# Patient Record
Sex: Male | Born: 1965 | Race: White | Hispanic: No | Marital: Married | Smoking: Current every day smoker
Health system: Southern US, Community
[De-identification: ages and names within clinical notes are randomized; demographics above are authoritative.]

## PROBLEM LIST (undated history)

## (undated) DIAGNOSIS — M1990 Unspecified osteoarthritis, unspecified site: Secondary | ICD-10-CM

## (undated) DIAGNOSIS — Z87442 Personal history of urinary calculi: Secondary | ICD-10-CM

## (undated) DIAGNOSIS — E7800 Pure hypercholesterolemia, unspecified: Secondary | ICD-10-CM

## (undated) DIAGNOSIS — F419 Anxiety disorder, unspecified: Secondary | ICD-10-CM

## (undated) DIAGNOSIS — I10 Essential (primary) hypertension: Secondary | ICD-10-CM

## (undated) DIAGNOSIS — E119 Type 2 diabetes mellitus without complications: Secondary | ICD-10-CM

## (undated) DIAGNOSIS — F329 Major depressive disorder, single episode, unspecified: Secondary | ICD-10-CM

## (undated) DIAGNOSIS — R011 Cardiac murmur, unspecified: Secondary | ICD-10-CM

## (undated) DIAGNOSIS — F32A Depression, unspecified: Secondary | ICD-10-CM

## (undated) HISTORY — PX: KNEE ARTHROSCOPY: SUR90

## (undated) HISTORY — PX: KNEE SURGERY: SHX244

---
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
## 2004-10-14 HISTORY — PX: OTHER SURGICAL HISTORY: SHX169

## 2005-05-06 ENCOUNTER — Encounter: Admission: RE | Admit: 2005-05-06 | Discharge: 2005-05-06 | Payer: Self-pay | Admitting: Orthopedic Surgery

## 2005-05-14 ENCOUNTER — Inpatient Hospital Stay (HOSPITAL_COMMUNITY): Admission: RE | Admit: 2005-05-14 | Discharge: 2005-05-16 | Payer: Self-pay | Admitting: Orthopedic Surgery

## 2005-06-05 ENCOUNTER — Encounter: Admission: RE | Admit: 2005-06-05 | Discharge: 2005-09-03 | Payer: Self-pay | Admitting: Orthopedic Surgery

## 2005-09-04 ENCOUNTER — Encounter: Admission: RE | Admit: 2005-09-04 | Discharge: 2005-10-25 | Payer: Self-pay | Admitting: Orthopedic Surgery

## 2013-04-21 ENCOUNTER — Other Ambulatory Visit: Payer: Self-pay | Admitting: Urology

## 2013-05-11 ENCOUNTER — Encounter (HOSPITAL_BASED_OUTPATIENT_CLINIC_OR_DEPARTMENT_OTHER): Payer: Self-pay | Admitting: *Deleted

## 2013-05-11 NOTE — Progress Notes (Signed)
NPO AFTER MN. ARRIVES AT 0600. NEEDS HG.  

## 2013-05-17 NOTE — H&P (Signed)
eason For Visit  [name] presents today for a 3 week check   History of Present Illness       [name] has a history of nephrolithiasis.  In 2012 he had a known left renal stone.  In early June 2014 he had some gross hematuria and mild left flank pain.  A KUB in our office did not show an obvious stone in the kidney as previously seen.  He was given Rapaflo for MET of possible ureteral stone.    Interval history: [name] presents today for a 3 week check.  He has not had any further gross hematuria or flank pain since his visit 3 weeks ago. He has not seen a stone pass. He denies any LUTS, dysuria, n/v, or fever. UA shows 11- 20 RBCs.   Past Medical History Problems  1. History of  Gross Hematuria 599.71 2. History of  Nephrolithiasis V13.01  Surgical History Problems  1. History of  Knee Surgery Left  Current Meds 1. No Reported Medications  Allergies Medication  1. No Known Drug Allergies  Family History Problems  1. Family history of  Death In The Family Father Died age 23-Heart 2. Maternal grandfather's history of  Diabetes Mellitus V18.0 3. Family history of  Family Health Status Number Of Children 1 son and 1 daughter 4. Paternal history of  Nephrolithiasis  Social History Problems  1. Alcohol Use 2 PER DAY 2. Caffeine Use 1-2 PER DAY 3. Marital History - Currently Married 4. Occupation: Self-employed 5. Tobacco Use 305.1 Smoked 1ppd for 25 years  Review of Systems Genitourinary, constitutional and gastrointestinal system(s) were reviewed and pertinent findings if present are noted.    Vitals Vital Signs [Data Includes: Last 1 Day]  08Jul2014 10:58AM  Blood Pressure: 142 / 98 Temperature: 99 F Heart Rate: 94  Physical Exam Constitutional: Well nourished and well developed . No acute distress.  Pulmonary: No respiratory distress and normal respiratory rhythm and effort.  Abdomen: The abdomen is soft and nontender. No CVA tenderness.   Neuro/Psych:. Mood and affect are appropriate.    Results/Data Urine [Data Includes: Last 1 Day]   08Jul2014  COLOR YELLOW   APPEARANCE CLEAR   SPECIFIC GRAVITY 1.020   pH 7.0   GLUCOSE NEG mg/dL  BILIRUBIN NEG   KETONE NEG mg/dL  BLOOD SMALL   PROTEIN NEG mg/dL  UROBILINOGEN 0.2 mg/dL  NITRITE NEG   LEUKOCYTE ESTERASE NEG   SQUAMOUS EPITHELIAL/HPF NONE SEEN   WBC NONE SEEN WBC/hpf  RBC 11-20 RBC/hpf  BACTERIA NONE SEEN   CRYSTALS NONE SEEN   CASTS NONE SEEN    The following images/tracing/specimen were independently visualized: Marland Kitchen  KUB: no obvious stones seen on KUB today overlying either kidney or along the course of either ureter CT: large 9+ mm stone seen in left ureter associated with hydro, no other obvious stones or renal lesions seen; official report still pending    Assessment Assessed  1. Gross Hematuria 599.71   [name] has ongoing micro hematuria today without evidence of stones on KUB.  For this reason, a CT hematuria protocol was performed. I see a large probably 9 mm stone in the left distal ureter associated with hydro.  I do not see any other stones or any other malignant cause of hematuria, but I am still awaiting the official radiology report.  He is going to continue Rapaflo.  I do not think hell be able to pass this, but maybe.  I am  going to send his CT to Dr. Annabell Howells.  He will likely need to be scheduled for ureteroscopy, stone extraction, and possible stent.  If he develops flank pain uncontrolled with medication, n/v, or a temp of 100. 5 or greater he knows to call.  He will also call if he passes the stone.   Plan Gross Hematuria (599.71)  1. AU CT-HEMATURIA PROTOCOL  Done: 08Jul2014 12:00AM Health Maintenance (V70.0)  2. UA With REFLEX  Done: 08Jul2014 10:38AM   1. Continue MET of 9mm distal ureteral stone on left 2. Consult with Dr. Annabell Howells about ureteroscopy/ stone extraction

## 2013-05-18 ENCOUNTER — Encounter (HOSPITAL_BASED_OUTPATIENT_CLINIC_OR_DEPARTMENT_OTHER)
Admission: RE | Disposition: A | Discharge status: Home / Self Care | Payer: Self-pay | Source: Ambulatory Visit | Attending: Urology

## 2013-05-18 ENCOUNTER — Encounter (HOSPITAL_BASED_OUTPATIENT_CLINIC_OR_DEPARTMENT_OTHER): Payer: Self-pay | Admitting: Anesthesiology

## 2013-05-18 ENCOUNTER — Ambulatory Visit (HOSPITAL_COMMUNITY): Payer: 59

## 2013-05-18 ENCOUNTER — Ambulatory Visit (HOSPITAL_BASED_OUTPATIENT_CLINIC_OR_DEPARTMENT_OTHER): Payer: 59 | Admitting: Anesthesiology

## 2013-05-18 ENCOUNTER — Ambulatory Visit (HOSPITAL_BASED_OUTPATIENT_CLINIC_OR_DEPARTMENT_OTHER)
Admission: RE | Admit: 2013-05-18 | Discharge: 2013-05-18 | Disposition: A | Discharge status: Home / Self Care | Payer: 59 | Source: Ambulatory Visit | Attending: Urology | Admitting: Urology

## 2013-05-18 ENCOUNTER — Encounter (HOSPITAL_BASED_OUTPATIENT_CLINIC_OR_DEPARTMENT_OTHER): Payer: Self-pay | Admitting: *Deleted

## 2013-05-18 DIAGNOSIS — E669 Obesity, unspecified: Secondary | ICD-10-CM | POA: Insufficient documentation

## 2013-05-18 DIAGNOSIS — Z302 Encounter for sterilization: Secondary | ICD-10-CM | POA: Insufficient documentation

## 2013-05-18 DIAGNOSIS — N201 Calculus of ureter: Secondary | ICD-10-CM | POA: Insufficient documentation

## 2013-05-18 DIAGNOSIS — F17200 Nicotine dependence, unspecified, uncomplicated: Secondary | ICD-10-CM | POA: Insufficient documentation

## 2013-05-18 HISTORY — PX: VASECTOMY: SHX75

## 2013-05-18 HISTORY — PX: CYSTOSCOPY WITH URETEROSCOPY AND STENT PLACEMENT: SHX6377

## 2013-05-18 HISTORY — PX: HOLMIUM LASER APPLICATION: SHX5852

## 2013-05-18 LAB — POCT HEMOGLOBIN-HEMACUE: Hemoglobin: 15.9 g/dL (ref 13.0–17.0)

## 2013-05-18 SURGERY — CYSTOURETEROSCOPY, WITH STENT INSERTION
Anesthesia: General | Site: Ureter | Laterality: Left | Wound class: Clean Contaminated

## 2013-05-18 MED ORDER — KETOROLAC TROMETHAMINE 30 MG/ML IJ SOLN
INTRAMUSCULAR | Status: DC | PRN
  Administered 2013-05-18: 30 mg via INTRAVENOUS

## 2013-05-18 MED ORDER — DEXAMETHASONE SODIUM PHOSPHATE 4 MG/ML IJ SOLN
INTRAMUSCULAR | Status: DC | PRN
  Administered 2013-05-18: 10 mg via INTRAVENOUS

## 2013-05-18 MED ORDER — SODIUM CHLORIDE 0.9 % IV SOLN
250.0000 mL | INTRAVENOUS | Status: DC | PRN
  Filled 2013-05-18: qty 250

## 2013-05-18 MED ORDER — ACETAMINOPHEN 650 MG RE SUPP
650.0000 mg | RECTAL | Status: DC | PRN
  Filled 2013-05-18: qty 1

## 2013-05-18 MED ORDER — OXYCODONE-ACETAMINOPHEN 5-325 MG PO TABS: 1.0000 | ORAL_TABLET | ORAL | Status: DC | PRN

## 2013-05-18 MED ORDER — SODIUM CHLORIDE 0.9 % IJ SOLN
3.0000 mL | Freq: Two times a day (BID) | INTRAMUSCULAR | Status: DC
  Filled 2013-05-18: qty 3

## 2013-05-18 MED ORDER — HYDROMORPHONE HCL PF 1 MG/ML IJ SOLN
0.2500 mg | INTRAMUSCULAR | Status: DC | PRN
  Filled 2013-05-18: qty 1

## 2013-05-18 MED ORDER — ONDANSETRON 4 MG PO TBDP: 8.0000 mg | ORAL_TABLET | Freq: Three times a day (TID) | ORAL | Status: DC | PRN

## 2013-05-18 MED ORDER — PROMETHAZINE HCL 25 MG/ML IJ SOLN
6.2500 mg | INTRAMUSCULAR | Status: DC | PRN
  Filled 2013-05-18: qty 1

## 2013-05-18 MED ORDER — HYOSCYAMINE SULFATE 0.125 MG SL SUBL: 0.1250 mg | SUBLINGUAL_TABLET | SUBLINGUAL | Status: DC | PRN

## 2013-05-18 MED ORDER — LACTATED RINGERS IV SOLN
INTRAVENOUS | Status: DC
  Administered 2013-05-18: 07:00:00 via INTRAVENOUS
  Filled 2013-05-18: qty 1000

## 2013-05-18 MED ORDER — ONDANSETRON HCL 4 MG/2ML IJ SOLN
4.0000 mg | Freq: Four times a day (QID) | INTRAMUSCULAR | Status: DC | PRN
  Filled 2013-05-18: qty 2

## 2013-05-18 MED ORDER — FENTANYL CITRATE 0.05 MG/ML IJ SOLN
INTRAMUSCULAR | Status: DC | PRN
  Administered 2013-05-18 (×2): 25 ug via INTRAVENOUS
  Administered 2013-05-18: 50 ug via INTRAVENOUS
  Administered 2013-05-18: 25 ug via INTRAVENOUS
  Administered 2013-05-18: 50 ug via INTRAVENOUS
  Administered 2013-05-18 (×5): 25 ug via INTRAVENOUS

## 2013-05-18 MED ORDER — LIDOCAINE HCL (CARDIAC) 20 MG/ML IV SOLN
INTRAVENOUS | Status: DC | PRN
  Administered 2013-05-18: 100 mg via INTRAVENOUS

## 2013-05-18 MED ORDER — ACETAMINOPHEN 325 MG PO TABS
650.0000 mg | ORAL_TABLET | ORAL | Status: DC | PRN
  Filled 2013-05-18: qty 2

## 2013-05-18 MED ORDER — BELLADONNA ALKALOIDS-OPIUM 16.2-60 MG RE SUPP
RECTAL | Status: DC | PRN
  Administered 2013-05-18: 1 via RECTAL

## 2013-05-18 MED ORDER — SODIUM CHLORIDE 0.9 % IJ SOLN
3.0000 mL | INTRAMUSCULAR | Status: DC | PRN
  Filled 2013-05-18: qty 3

## 2013-05-18 MED ORDER — PHENAZOPYRIDINE HCL 200 MG PO TABS: 200.0000 mg | ORAL_TABLET | Freq: Three times a day (TID) | ORAL | Status: DC | PRN

## 2013-05-18 MED ORDER — CIPROFLOXACIN IN D5W 400 MG/200ML IV SOLN
400.0000 mg | INTRAVENOUS | Status: AC
  Administered 2013-05-18: 400 mg via INTRAVENOUS
  Filled 2013-05-18: qty 200

## 2013-05-18 MED ORDER — BUPIVACAINE HCL 0.25 % IJ SOLN
INTRAMUSCULAR | Status: DC | PRN
  Administered 2013-05-18: 2 mL

## 2013-05-18 MED ORDER — FENTANYL CITRATE 0.05 MG/ML IJ SOLN
25.0000 ug | INTRAMUSCULAR | Status: DC | PRN
  Filled 2013-05-18: qty 1

## 2013-05-18 MED ORDER — SODIUM CHLORIDE 0.9 % IR SOLN
Status: DC | PRN
  Administered 2013-05-18: 3000 mL

## 2013-05-18 MED ORDER — METOCLOPRAMIDE HCL 5 MG/ML IJ SOLN
INTRAMUSCULAR | Status: DC | PRN
  Administered 2013-05-18: 10 mg via INTRAVENOUS

## 2013-05-18 MED ORDER — PROPOFOL 10 MG/ML IV BOLUS
INTRAVENOUS | Status: DC | PRN
  Administered 2013-05-18: 300 mg via INTRAVENOUS

## 2013-05-18 MED ORDER — MIDAZOLAM HCL 5 MG/5ML IJ SOLN
INTRAMUSCULAR | Status: DC | PRN
  Administered 2013-05-18: 2 mg via INTRAVENOUS
  Administered 2013-05-18 (×2): 1 mg via INTRAVENOUS

## 2013-05-18 MED ORDER — LACTATED RINGERS IV SOLN
INTRAVENOUS | Status: DC | PRN
  Administered 2013-05-18 (×2): via INTRAVENOUS

## 2013-05-18 MED ORDER — OXYCODONE HCL 5 MG PO TABS
5.0000 mg | ORAL_TABLET | ORAL | Status: DC | PRN
  Filled 2013-05-18: qty 2

## 2013-05-18 MED ORDER — IOHEXOL 350 MG/ML SOLN
INTRAVENOUS | Status: DC | PRN
  Administered 2013-05-18: 15 mL via INTRAVENOUS

## 2013-05-18 MED ORDER — LACTATED RINGERS IV SOLN
INTRAVENOUS | Status: DC
  Filled 2013-05-18: qty 1000

## 2013-05-18 MED ORDER — ONDANSETRON HCL 4 MG/2ML IJ SOLN
INTRAMUSCULAR | Status: DC | PRN
  Administered 2013-05-18: 4 mg via INTRAVENOUS

## 2013-05-18 SURGICAL SUPPLY — 59 items
BAG DRAIN URO-CYSTO SKYTR STRL (DRAIN) ×3 IMPLANT
BANDAGE GAUZE ELAST BULKY 4 IN (GAUZE/BANDAGES/DRESSINGS) ×3 IMPLANT
BASKET LASER NITINOL 1.9FR (BASKET) IMPLANT
BASKET SEGURA 3FR (UROLOGICAL SUPPLIES) IMPLANT
BASKET STNLS GEMINI 4WIRE 3FR (BASKET) IMPLANT
BASKET ZERO TIP NITINOL 2.4FR (BASKET) ×3 IMPLANT
BLADE SURG 15 STRL LF DISP TIS (BLADE) ×2 IMPLANT
BLADE SURG 15 STRL SS (BLADE) ×1
BLADE SURG ROTATE 9660 (MISCELLANEOUS) ×3 IMPLANT
BRUSH URET BIOPSY 3F (UROLOGICAL SUPPLIES) IMPLANT
CANISTER SUCT LVC 12 LTR MEDI- (MISCELLANEOUS) ×3 IMPLANT
CATH URET 5FR 28IN CONE TIP (BALLOONS)
CATH URET 5FR 28IN OPEN ENDED (CATHETERS) ×3 IMPLANT
CATH URET 5FR 70CM CONE TIP (BALLOONS) IMPLANT
CLEANER CAUTERY TIP 5X5 PAD (MISCELLANEOUS) ×2 IMPLANT
CLOTH BEACON ORANGE TIMEOUT ST (SAFETY) ×3 IMPLANT
COVER MAYO STAND STRL (DRAPES) ×3 IMPLANT
COVER TABLE BACK 60X90 (DRAPES) ×3 IMPLANT
DERMABOND ADVANCED (GAUZE/BANDAGES/DRESSINGS) ×1
DERMABOND ADVANCED .7 DNX12 (GAUZE/BANDAGES/DRESSINGS) ×2 IMPLANT
DRAPE CAMERA CLOSED 9X96 (DRAPES) ×3 IMPLANT
DRAPE PED LAPAROTOMY (DRAPES) IMPLANT
ELECT NEEDLE TIP 2.8 STRL (NEEDLE) ×3 IMPLANT
ELECT REM PT RETURN 9FT ADLT (ELECTROSURGICAL) ×3
ELECTRODE REM PT RTRN 9FT ADLT (ELECTROSURGICAL) ×2 IMPLANT
GLOVE INDICATOR 6.5 STRL GRN (GLOVE) ×6 IMPLANT
GLOVE SURG SS PI 8.0 STRL IVOR (GLOVE) ×3 IMPLANT
GOWN STRL NON-REIN LRG LVL3 (GOWN DISPOSABLE) ×3 IMPLANT
GOWN STRL REIN XL XLG (GOWN DISPOSABLE) ×3 IMPLANT
GOWN W/COTTON TOWEL STD LRG (GOWNS) ×3 IMPLANT
GOWN XL W/COTTON TOWEL STD (GOWNS) ×3 IMPLANT
GUIDEWIRE 0.038 PTFE COATED (WIRE) IMPLANT
GUIDEWIRE ANG ZIPWIRE 038X150 (WIRE) IMPLANT
GUIDEWIRE STR DUAL SENSOR (WIRE) ×3 IMPLANT
IV NS IRRIG 3000ML ARTHROMATIC (IV SOLUTION) ×6 IMPLANT
KIT BALLIN UROMAX 15FX10 (LABEL) ×2 IMPLANT
KIT BALLN UROMAX 15FX4 (MISCELLANEOUS) IMPLANT
KIT BALLN UROMAX 26 75X4 (MISCELLANEOUS)
LASER FIBER DISP (UROLOGICAL SUPPLIES) ×3 IMPLANT
LASER FIBER DISP 1000U (UROLOGICAL SUPPLIES) IMPLANT
MARKER SKIN DUAL TIP RULER LAB (MISCELLANEOUS) IMPLANT
NEEDLE 27GAX1X1/2 (NEEDLE) IMPLANT
NEEDLE HYPO 25X1 1.5 SAFETY (NEEDLE) ×3 IMPLANT
NS IRRIG 500ML POUR BTL (IV SOLUTION) IMPLANT
PACK BASIN DAY SURGERY FS (CUSTOM PROCEDURE TRAY) IMPLANT
PACK CYSTOSCOPY (CUSTOM PROCEDURE TRAY) ×3 IMPLANT
PAD CLEANER CAUTERY TIP 5X5 (MISCELLANEOUS) ×1
PENCIL BUTTON HOLSTER BLD 10FT (ELECTRODE) ×3 IMPLANT
SET HIGH PRES BAL DIL (LABEL) ×1
SHEATH ACCESS URETERAL 38CM (SHEATH) ×3 IMPLANT
SHEATH ACCESS URETERAL 54CM (SHEATH) IMPLANT
SUPPORT SCROTAL LG STRP (MISCELLANEOUS) ×3 IMPLANT
SUT CHROMIC 4 0 SH 27 (SUTURE) ×3 IMPLANT
SUT VIC AB 2-0 SH 27 (SUTURE) ×1
SUT VIC AB 2-0 SH 27XBRD (SUTURE) ×2 IMPLANT
SUT VICRYL 0 TIES 12 18 (SUTURE) ×3 IMPLANT
SYR CONTROL 10ML LL (SYRINGE) ×3 IMPLANT
TOWEL OR 17X24 6PK STRL BLUE (TOWEL DISPOSABLE) ×3 IMPLANT
WATER STERILE IRR 500ML POUR (IV SOLUTION) IMPLANT

## 2013-05-18 NOTE — Brief Op Note (Signed)
05/18/2013  8:38 AM  PATIENT:  [name]  47 y.o. male  PRE-OPERATIVE DIAGNOSIS:  LEFT MID URETERAL STONE,  DESIRES STERILIZATION   POST-OPERATIVE DIAGNOSIS:  LEFT MID URETERAL STONE,  DESIRES STERILIZATION   PROCEDURE:  Procedure(s): LEFT URETEROSCOPY, Retrograde Pyelogram, Left Ureteral Dilation,STONE EXTRACTION, STENT PLACEMENT  (Left) BILATERAL VASECTOMY (Bilateral) HOLMIUM LASER APPLICATION (Left)  SURGEON:  Surgeon(s) and Role:    * Anner Crete, MD - Primary  PHYSICIAN ASSISTANT:   ASSISTANTS: none   ANESTHESIA:   local and general  EBL:  Total I/O In: 1000 [I.V.:1000] Out: -   BLOOD ADMINISTERED:none  DRAINS: left 6x26 JJ stent   LOCAL MEDICATIONS USED:  MARCAINE     SPECIMEN:  Source of Specimen:  stone fragments  DISPOSITION OF SPECIMEN:  to family  COUNTS:  YES  TOURNIQUET:  * No tourniquets in log *  DICTATION: .Other Dictation: Dictation Number [id number]  PLAN OF CARE: Discharge to home after PACU  PATIENT DISPOSITION:  PACU - hemodynamically stable.   Delay start of Pharmacological VTE agent (>24hrs) due to surgical blood loss or risk of bleeding: not applicable

## 2013-05-18 NOTE — Anesthesia Preprocedure Evaluation (Signed)
Anesthesia Evaluation  Patient identified by MRN, date of birth, ID band Patient awake    Reviewed: Allergy & Precautions, H&P , NPO status , Patient's Chart, lab work & pertinent test results  Airway Mallampati: III TM Distance: >3 FB Neck ROM: Full    Dental  (+) Teeth Intact and Chipped,    Pulmonary Current Smoker,  breath sounds clear to auscultation  Pulmonary exam normal       Cardiovascular negative cardio ROS  Rhythm:Regular Rate:Normal     Neuro/Psych negative neurological ROS  negative psych ROS   GI/Hepatic negative GI ROS, Neg liver ROS,   Endo/Other  negative endocrine ROS  Renal/GU negative Renal ROS  negative genitourinary   Musculoskeletal negative musculoskeletal ROS (+)   Abdominal (+) + obese,   Peds  Hematology negative hematology ROS (+)   Anesthesia Other Findings   Reproductive/Obstetrics                           Anesthesia Physical Anesthesia Plan  ASA: II  Anesthesia Plan: General   Post-op Pain Management:    Induction: Intravenous  Airway Management Planned: LMA  Additional Equipment:   Intra-op Plan:   Post-operative Plan: Extubation in OR  Informed Consent: I have reviewed the patients History and Physical, chart, labs and discussed the procedure including the risks, benefits and alternatives for the proposed anesthesia with the patient or authorized representative who has indicated his/her understanding and acceptance.   Dental advisory given  Plan Discussed with: CRNA  Anesthesia Plan Comments:         Anesthesia Quick Evaluation

## 2013-05-18 NOTE — Anesthesia Procedure Notes (Signed)
Procedure Name: LMA Insertion Date/Time: 05/18/2013 7:37 AM Performed by: Jessica Priest Pre-anesthesia Checklist: Patient identified, Emergency Drugs available, Suction available and Patient being monitored Patient Re-evaluated:Patient Re-evaluated prior to inductionOxygen Delivery Method: Circle System Utilized Preoxygenation: Pre-oxygenation with 100% oxygen Intubation Type: IV induction Ventilation: Mask ventilation without difficulty LMA: LMA inserted LMA Size: 5.0 Number of attempts: 1 Airway Equipment and Method: bite block Placement Confirmation: positive ETCO2 Tube secured with: Tape Dental Injury: Teeth and Oropharynx as per pre-operative assessment

## 2013-05-18 NOTE — Anesthesia Postprocedure Evaluation (Signed)
Anesthesia Post Note  Patient: [name]  Procedure(s) Performed: Procedure(s) (LRB): LEFT URETEROSCOPY, Retrograde Pyelogram, Left Ureteral Dilation,STONE EXTRACTION, STENT PLACEMENT  (Left) BILATERAL VASECTOMY (Bilateral) HOLMIUM LASER APPLICATION (Left)  Anesthesia type: General  Patient location: PACU  Post pain: Pain level controlled  Post assessment: Post-op Vital signs reviewed  Last Vitals:  Filed Vitals:   05/18/13 1035  BP: 133/92  Pulse: 73  Temp: 36.4 C  Resp: 16    Post vital signs: Reviewed  Level of consciousness: sedated  Complications: No apparent anesthesia complications

## 2013-05-18 NOTE — Interval H&P Note (Signed)
History and Physical Interval Note:  05/18/2013 7:19 AM  [name]  has presented today for surgery, with the diagnosis of LEFT MID URETERAL STONE,  DESIRES STERILIZATION   The various methods of treatment have been discussed with the patient and family. After consideration of risks, benefits and other options for treatment, the patient has consented to  Procedure(s): LEFT URETEROSCOPY STONE EXTRACTION WITH HOLMIUM LASER POSSIBLE STENT PLACEMENT  (Left) BILATERAL VASECTOMY (Bilateral) HOLMIUM LASER APPLICATION (N/A) as a surgical intervention .  The patient's history has been reviewed, patient examined, no change in status, stable for surgery.  I have reviewed the patient's chart and labs.  Questions were answered to the patient's satisfaction.     [name]

## 2013-05-18 NOTE — Transfer of Care (Signed)
Immediate Anesthesia Transfer of Care Note  Patient: [name]  Procedure(s) Performed: Procedure(s) (LRB): LEFT URETEROSCOPY, Retrograde Pyelogram, Left Ureteral Dilation,STONE EXTRACTION, STENT PLACEMENT  (Left) BILATERAL VASECTOMY (Bilateral) HOLMIUM LASER APPLICATION (Left)  Patient Location: PACU  Anesthesia Type: General  Level of Consciousness: awake, sedated, patient cooperative and responds to stimulation  Airway & Oxygen Therapy: Patient Spontanous Breathing and Patient connected to face mask oxygen  Post-op Assessment: Report given to PACU RN, Post -op Vital signs reviewed and stable and Patient moving all extremities  Post vital signs: Reviewed and stable  Complications: No apparent anesthesia complications

## 2013-05-19 ENCOUNTER — Encounter (HOSPITAL_BASED_OUTPATIENT_CLINIC_OR_DEPARTMENT_OTHER): Payer: Self-pay | Admitting: Urology

## 2013-05-19 NOTE — Op Note (Signed)
[name], [name] NO.:  [id number]  MEDICAL RECORD NO.:  [id number]  LOCATION:                                 FACILITY:  PHYSICIAN:  Excell Seltzer. Annabell Howells, M.D.    DATE OF BIRTH:  Dec 25, 1965  DATE OF PROCEDURE:  05/18/2013 DATE OF DISCHARGE:                              OPERATIVE REPORT   PROCEDURE: 1. Bilateral vasectomy. 2. Cystoscopy with left retrograde pyelogram with interpretation. 3. Left ureteroscopy with holmium lasertripsy and stone extraction. 4. Insertion of left double-J stent.  PREOPERATIVE DIAGNOSIS:  A 9 mm left distal ureteral stone and desire for elective sterilization.  POSTOPERATIVE DIAGNOSIS:  A 9 mm left distal ureteral stone and desire for elective sterilization.  SURGEON:  Excell Seltzer. Annabell Howells, M.D.  ANESTHESIA:  General.  SPECIMEN:  Stone fragments.  The vas sections were discarded.  DRAINS:  A 6-French 26-cm left double-J stent.  BLOOD LOSS:  Minimal.  COMPLICATIONS:  None.  INDICATIONS:  [name] is a 47 year old white male with a nonobstructing 9 mm, but minimally symptomatic left ureteral stone.  He has elected ureteroscopy.  He has also elected to undergo vasectomy for sterilization.  FINDINGS OF PROCEDURE:  He was given Cipro 400 mg IV, was taken to the operating room, where general anesthetic was induced.  He was placed in lithotomy position and fitted with PAS hose.  His scrotum was clipped. He was prepped with Betadine solution and draped in the usual sterile fashion.  The vasectomy was performed.  First, the right vas was palpated against the anterior scrotal skin and secured with a ring clamp.  A 0.25% Marcaine without epinephrine was then instilled approximately 2 mL.  The skin was pierced with the sharpened hemostat, then spread exposing the vas which was then isolated using sharp and towel clips.  A 1 cm section of the vas was then excised.  The distal lumen was fulgurated with the needlepoint Bovie.  Fascial  interposition was performed using 4-0 chromic and the proximal end was then ligated with a 4-0 chromic.  Once hemostasis was assured, the ends of the vas were returned to the scrotum after ensuring that the right side had been treated in the initial dissection. The left vas was then brought up through the same incision, isolated, and divided with a section removed and discarded.  The distal lumen was fulgurated with the Bovie.  The fascial interposition was performed with 4-0 chromic and the proximal end was then ligated with a 4-0 chromic.  Once again, palpation was performed to ensure that the left was dealt within this particular dissection.  Additionally prior to return of the vas to the scrotum, an additional 2 mL of Marcaine was infiltrated for postoperative pain relief.  At this point, the ends were returned to the scrotum.  There was no bleeding from the wound which was then closed with Dermabond.  At this point, cystoscopy was performed using a 22-French scope and 12 and 70 degree lenses.  Examination revealed a normal urethra.  The external sphincter was intact.  The prostatic urethra was approximately 2 cm in length with bilobar hyperplasia with minimal obstruction. Examination of bladder revealed  a smooth wall without tumor, stones, or inflammation.  Ureteral orifices were unremarkable.  The left ureteral orifice was cannulated with 5-French open-end catheter and contrast was instilled.  This revealed a normal ureter up to the inferior edge of the sacroiliac junction, where a filling defect was noted consistent with a stone.  Contrast flowed proximal to this.  There was minimal dilation proximally.  Contrast than refluxed to the kidney without difficulty.  Once in position, the stone was confirmed. A guidewire was passed to the kidney.  The cystoscope was removed and a 12-French access sheath inner core was then used to dilate the distal ureter.  However, it would not go  beyond the meatus.  At this point, a 10 cm 15-French balloon was passed and inflated to 18 atmospheres in the distal ureter.  No significant waist was noted.  The balloon was removed after a minute and the 6.4-French short ureteroscope was then inserted alongside the wire.  I was able to negotiate this to the stone without difficulty.  A 365 micron laser fiber was then placed through the scope.  It was set on 0.5 watts and 10 hertz, and the stone was fragmented into manageable fragments.  The fragments were then retrieved with a basket until only a small dust and stand remained.  At this point, inspection revealed some mucosal irritation from retrieval of the jagged stone fragment and it was felt that stenting was indicated.  The cystoscope was then reinserted alongside the wire and the stone fragments which had been retrieved to the bladder were then evacuated. The cystoscope was then reinserted over the wire and a 6-French 26 cm double-J stent with string was inserted to the kidney without difficulty under fluoroscopic guidance.  The wire was removed leaving good coil in the kidney and a good coil in the bladder.  The bladder was drained. The cystoscope was removed leaving the stent string exiting urethra. The string was secured to the patient's penis.  Stone fragments were retrieved and will be given to the family to bring it to the office for analysis.  He was given Toradol and a B and O suppository.  He was taken down from lithotomy position.  His anesthetic was reversed.  He was moved to the recovery room in stable condition.  There were no complications.     Excell Seltzer. Annabell Howells, M.D.    JJW/MEDQ  D:  05/18/2013  T:  05/18/2013  Job:  [id number]

## 2015-02-09 ENCOUNTER — Other Ambulatory Visit: Payer: Self-pay | Admitting: Urology

## 2015-03-09 ENCOUNTER — Encounter (HOSPITAL_BASED_OUTPATIENT_CLINIC_OR_DEPARTMENT_OTHER): Payer: Self-pay | Admitting: *Deleted

## 2015-03-09 NOTE — Progress Notes (Signed)
NPO AFTER MN.  ARRIVE AT 4970.  NEEDS HG. MAY TAKE PAIN RX IF NEEDED AM DOS W/ SIPS OF WATER.

## 2015-03-15 NOTE — H&P (Signed)
Active Problems Problems  1. Erectile dysfunction due to arterial insufficiency (N52.01) 2. Gross hematuria (R31.0) 3. Kidney stone on right side (N20.0) 4. Prostate cancer screening (Z12.5)  History of Present Illness [name] returns today in f/u following a CT for painless gross hematuria. He had onset about 2 weeks ago after tilling the garden.  He was found to have an 8.75mm stone in the right renal pelvis that is non-obstructing and about 570 HU on CT.  I am unable to see the stone on KUB today. He no longer having gross hematuria but has TNTC RBC's on UA. He has no pain.   Past Medical History Problems  1. History of Gross hematuria (R31.0) 2. History of kidney stones (Z87.442) 3. History of Kidney stone on left side (N20.0)  Surgical History Problems  1. History of Cystoscopy With Insertion Of Ureteral Stent Left 2. History of Cystoscopy With Ureteroscopy With Lithotripsy 3. History of Knee Surgery Left 4. History of Surgery Of Male Genitalia Vasectomy  Current Meds 1. Advil PM TABS;  Therapy: (Recorded:13Apr2016) to Recorded 2. Fish Oil CAPS;  Therapy: (Recorded:13Apr2016) to Recorded  Allergies Medication  1. No Known Drug Allergies  Family History Problems  1. Family history of Death In The Family Father   Died age 35-Heart 2. Family history of Diabetes Mellitus : Maternal Grandfather 3. Family history of Family Health Status Number Of Children   1 son and 1 daughter 4. Family history of Nephrolithiasis : Father  Social History Problems  1. Alcohol Use   2 PER DAY 2. Caffeine Use   1-2 PER DAY 3. Current every day smoker (F17.200) 4. Marital History - Currently Married 5. Occupation:   Self-employed 6. Tobacco use (Z72.0)   Smoked 1ppd for 25 years  Past and social history reviewed and updated.   Review of Systems Genitourinary, constitutional, skin, eye, otolaryngeal, hematologic/lymphatic, cardiovascular, pulmonary, endocrine,  musculoskeletal, gastrointestinal, neurological and psychiatric system(s) were reviewed and pertinent findings if present are noted and are otherwise negative.  Genitourinary: erectile dysfunction.    Vitals Vital Signs [Data Includes: Last 1 Day]  Recorded: 26Apr2016 12:08PM  Blood Pressure: 135 / 91 Temperature: 97.4 F Heart Rate: 101  Physical Exam Constitutional: Well nourished and well developed . No acute distress.  Pulmonary: No respiratory distress and normal respiratory rhythm and effort.  Cardiovascular: Heart rate and rhythm are normal . No peripheral edema.    Results/Data Urine [Data Includes: Last 1 Day]   26Apr2016  COLOR YELLOW   APPEARANCE CLEAR   SPECIFIC GRAVITY 1.025   pH 6.0   GLUCOSE NEG mg/dL  BILIRUBIN NEG   KETONE NEG mg/dL  BLOOD LARGE   PROTEIN NEG mg/dL  UROBILINOGEN 0.2 mg/dL  NITRITE NEG   LEUKOCYTE ESTERASE NEG   SQUAMOUS EPITHELIAL/HPF NONE SEEN   WBC NONE SEEN WBC/hpf  RBC TNTC RBC/hpf  BACTERIA RARE   CRYSTALS NONE SEEN   CASTS NONE SEEN   Selected Results  AU CT-HEMATURIA PROTOCOL 20Apr2016 12:00AM [name]   Test Name Result Flag Reference  AU CT-HEMATURIA PROTOCOL (Report)    ** RADIOLOGY REPORT BY [facility] **   CLINICAL DATA: Gross hematuria.  EXAM: CT ABDOMEN AND PELVIS WITHOUT AND WITH CONTRAST  TECHNIQUE: Multidetector CT imaging of the abdomen and pelvis was performed following the standard protocol before and following the bolus administration of intravenous contrast.  CONTRAST: 125 cc of Isovue  COMPARISON: 04/20/2013  FINDINGS: Lower chest: No pleural or pericardial effusion identified. The lung bases  appear clear  Hepatobiliary: Hepatic steatosis noted. No focal liver abnormality identified. The gallbladder appears will normal. There is no biliary dilatation.  Pancreas: Negative  Spleen: Negative  Adrenals/Urinary Tract: The adrenal glands are negative. Stone in the right renal  pelvis is identified which measures 8 mm, image 38/series 2. There is a punctate stone within the inferior pole calyx of the right kidney measuring 1-2 mm. No left renal calculi. The urinary bladder appears within normal limits.  Stomach/Bowel: The stomach appears normal. The small bowel loops have a normal course and caliber. No obstruction. The appendix is visualized and is normal. Unremarkable appearance of the colon.  Vascular/Lymphatic: Calcified atherosclerotic disease involves the abdominal aorta. No aneurysm. No enlarged retroperitoneal or mesenteric adenopathy. No enlarged pelvic or inguinal lymph nodes.  Reproductive: The prostate gland and seminal vesicles are unremarkable.  Other: There is no ascites or focal fluid collections within the abdomen or pelvis.  Musculoskeletal: Review of the visualized osseous structures is negative for aggressive lytic or sclerotic bone lesion. Degenerative disc disease is noted at T11-T12 and T12-L1.  IMPRESSION: 1. Stone within the right renal pelvis measures 8 mm and may account for the patient's hematuria. This does not cause significant right hydronephrosis. 2. Hepatic steatosis   Electronically Signed  By: Kerby Moors M.D.  On: 02/01/2015 11:19   PSA [id number] 10:19AM [name]  SPECIMEN TYPE: BLOOD   Test Name Result Flag Reference  PSA 0.48 ng/mL  <=4.00  TEST METHODOLOGY: ECLIA PSA (ELECTROCHEMILUMINESCENCE IMMUNOASSAY)   Assessment Assessed  1. Kidney stone on right side (N20.0) 2. 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   He has persistent ED.   Plan Health Maintenance  1. UA With REFLEX; [Do Not Release]; Status:Resulted - Requires Verification;   Done:  26Apr2016 11:38AM Kidney stone on right side  2. Start: Oxycodone-Acetaminophen 5-325 MG Oral Tablet; take 1 or 2 tablets q 4-6 hours  prn pain 3. Follow-up  Schedule Surgery Office  Follow-up  Status: Hold For - Appointment   Requested for: 26Apr2016 4. HYPERCALCIURA PROFILE; Status:Hold For - Specimen/Data Collection,Appointment;  Requested for:26Apr2016;  5. Stone Analysis; Status:Hold For - Specimen/Data Collection,Appointment; Requested  for:26Apr2016;   His stone is not well seen on KUB so he is going to need ureteroscopy with laser and stent. I reviewed the risks of bleeding, infection, ureteral injury, need for stents and secondary procedures, thrombotic events and anesthetic risks.  He wants to wait on that until later in May.  I have given him a script for oxycodone.   I have given him a script for sildenafil.  Hypercalcuria profile ordered.

## 2015-03-16 ENCOUNTER — Ambulatory Visit (HOSPITAL_BASED_OUTPATIENT_CLINIC_OR_DEPARTMENT_OTHER): Payer: 59 | Admitting: Anesthesiology

## 2015-03-16 ENCOUNTER — Encounter (HOSPITAL_BASED_OUTPATIENT_CLINIC_OR_DEPARTMENT_OTHER): Payer: Self-pay

## 2015-03-16 ENCOUNTER — Ambulatory Visit (HOSPITAL_BASED_OUTPATIENT_CLINIC_OR_DEPARTMENT_OTHER)
Admission: RE | Admit: 2015-03-16 | Discharge: 2015-03-16 | Disposition: A | Discharge status: Home / Self Care | Payer: 59 | Source: Ambulatory Visit | Attending: Urology | Admitting: Urology

## 2015-03-16 ENCOUNTER — Encounter (HOSPITAL_BASED_OUTPATIENT_CLINIC_OR_DEPARTMENT_OTHER)
Admission: RE | Disposition: A | Discharge status: Home / Self Care | Payer: Self-pay | Source: Ambulatory Visit | Attending: Urology

## 2015-03-16 DIAGNOSIS — N200 Calculus of kidney: Secondary | ICD-10-CM | POA: Diagnosis not present

## 2015-03-16 DIAGNOSIS — F17210 Nicotine dependence, cigarettes, uncomplicated: Secondary | ICD-10-CM | POA: Diagnosis not present

## 2015-03-16 HISTORY — PX: CYSTOSCOPY/URETEROSCOPY/HOLMIUM LASER/STENT PLACEMENT: SHX6546

## 2015-03-16 HISTORY — DX: Personal history of urinary calculi: Z87.442

## 2015-03-16 LAB — POCT HEMOGLOBIN-HEMACUE: Hemoglobin: 11.2 g/dL — ABNORMAL LOW (ref 13.0–17.0)

## 2015-03-16 SURGERY — CYSTOSCOPY/URETEROSCOPY/HOLMIUM LASER/STENT PLACEMENT
Anesthesia: General | Site: Ureter | Laterality: Right

## 2015-03-16 MED ORDER — ACETAMINOPHEN 10 MG/ML IV SOLN
INTRAVENOUS | Status: DC | PRN
  Administered 2015-03-16: 1000 mg via INTRAVENOUS

## 2015-03-16 MED ORDER — IOHEXOL 350 MG/ML SOLN
INTRAVENOUS | Status: DC | PRN
  Administered 2015-03-16: 18 mL

## 2015-03-16 MED ORDER — HYOSCYAMINE SULFATE 0.125 MG SL SUBL: 0.1250 mg | SUBLINGUAL_TABLET | SUBLINGUAL | Status: DC | PRN

## 2015-03-16 MED ORDER — PHENAZOPYRIDINE HCL 200 MG PO TABS: 200.0000 mg | ORAL_TABLET | Freq: Three times a day (TID) | ORAL | Status: DC | PRN

## 2015-03-16 MED ORDER — OXYCODONE HCL 5 MG PO TABS: 5.0000 mg | ORAL_TABLET | ORAL | Status: DC | PRN

## 2015-03-16 MED ORDER — LACTATED RINGERS IV SOLN
INTRAVENOUS | Status: DC
  Administered 2015-03-16: 09:00:00 via INTRAVENOUS
  Filled 2015-03-16: qty 1000

## 2015-03-16 MED ORDER — ONDANSETRON 4 MG PO TBDP: 4.0000 mg | ORAL_TABLET | Freq: Three times a day (TID) | ORAL | Status: DC | PRN

## 2015-03-16 MED ORDER — MIDAZOLAM HCL 5 MG/5ML IJ SOLN
INTRAMUSCULAR | Status: DC | PRN
  Administered 2015-03-16: 2 mg via INTRAVENOUS

## 2015-03-16 MED ORDER — FENTANYL CITRATE (PF) 100 MCG/2ML IJ SOLN
INTRAMUSCULAR | Status: DC | PRN
  Administered 2015-03-16 (×2): 50 ug via INTRAVENOUS
  Administered 2015-03-16 (×2): 25 ug via INTRAVENOUS
  Administered 2015-03-16: 50 ug via INTRAVENOUS

## 2015-03-16 MED ORDER — LIDOCAINE HCL (CARDIAC) 20 MG/ML IV SOLN
INTRAVENOUS | Status: DC | PRN
  Administered 2015-03-16: 100 mg via INTRAVENOUS

## 2015-03-16 MED ORDER — PROPOFOL 10 MG/ML IV BOLUS
INTRAVENOUS | Status: DC | PRN
  Administered 2015-03-16 (×2): 100 mg via INTRAVENOUS
  Administered 2015-03-16: 300 mg via INTRAVENOUS

## 2015-03-16 MED ORDER — MIDAZOLAM HCL 2 MG/2ML IJ SOLN
INTRAMUSCULAR | Status: AC
  Filled 2015-03-16: qty 2

## 2015-03-16 MED ORDER — BELLADONNA ALKALOIDS-OPIUM 16.2-60 MG RE SUPP
RECTAL | Status: AC
  Filled 2015-03-16: qty 1

## 2015-03-16 MED ORDER — CIPROFLOXACIN IN D5W 400 MG/200ML IV SOLN
400.0000 mg | INTRAVENOUS | Status: AC
  Administered 2015-03-16: 400 mg via INTRAVENOUS
  Filled 2015-03-16: qty 200

## 2015-03-16 MED ORDER — FENTANYL CITRATE (PF) 100 MCG/2ML IJ SOLN
INTRAMUSCULAR | Status: AC
  Filled 2015-03-16: qty 4

## 2015-03-16 MED ORDER — CIPROFLOXACIN IN D5W 400 MG/200ML IV SOLN
INTRAVENOUS | Status: AC
  Filled 2015-03-16: qty 200

## 2015-03-16 MED ORDER — DEXAMETHASONE SODIUM PHOSPHATE 4 MG/ML IJ SOLN
INTRAMUSCULAR | Status: DC | PRN
  Administered 2015-03-16: 10 mg via INTRAVENOUS

## 2015-03-16 MED ORDER — ONDANSETRON HCL 4 MG/2ML IJ SOLN
INTRAMUSCULAR | Status: DC | PRN
  Administered 2015-03-16: 4 mg via INTRAVENOUS

## 2015-03-16 MED ORDER — SODIUM CHLORIDE 0.9 % IR SOLN
Status: DC | PRN
  Administered 2015-03-16: 1000 mL
  Administered 2015-03-16: 3000 mL

## 2015-03-16 SURGICAL SUPPLY — 34 items
BAG DRAIN URO-CYSTO SKYTR STRL (DRAIN) ×3 IMPLANT
BASKET LASER NITINOL 1.9FR (BASKET) IMPLANT
BASKET ZERO TIP NITINOL 2.4FR (BASKET) ×3 IMPLANT
CANISTER SUCT LVC 12 LTR MEDI- (MISCELLANEOUS) ×3 IMPLANT
CATH URET 5FR 28IN CONE TIP (BALLOONS) ×4
CATH URET 5FR 28IN OPEN ENDED (CATHETERS) IMPLANT
CATH URET 5FR 70CM CONE TIP (BALLOONS) ×2 IMPLANT
CLOTH BEACON ORANGE TIMEOUT ST (SAFETY) ×3 IMPLANT
ELECT REM PT RETURN 9FT ADLT (ELECTROSURGICAL)
ELECTRODE REM PT RTRN 9FT ADLT (ELECTROSURGICAL) IMPLANT
FIBER LASER FLEXIVA 1000 (UROLOGICAL SUPPLIES) IMPLANT
FIBER LASER FLEXIVA 365 (UROLOGICAL SUPPLIES) IMPLANT
FIBER LASER FLEXIVA 550 (UROLOGICAL SUPPLIES) IMPLANT
FIBER LASER TRAC TIP (UROLOGICAL SUPPLIES) ×3 IMPLANT
GLOVE BIOGEL PI IND STRL 7.0 (GLOVE) ×1 IMPLANT
GLOVE BIOGEL PI INDICATOR 7.0 (GLOVE) ×2
GLOVE INDICATOR 6.5 STRL GRN (GLOVE) ×3 IMPLANT
GLOVE SURG SS PI 8.0 STRL IVOR (GLOVE) ×3 IMPLANT
GOWN STRL REUS W/ TWL LRG LVL3 (GOWN DISPOSABLE) ×2 IMPLANT
GOWN STRL REUS W/ TWL XL LVL3 (GOWN DISPOSABLE) IMPLANT
GOWN STRL REUS W/TWL LRG LVL3 (GOWN DISPOSABLE) ×4
GOWN STRL REUS W/TWL XL LVL3 (GOWN DISPOSABLE) ×3 IMPLANT
GUIDEWIRE 0.038 PTFE COATED (WIRE) IMPLANT
GUIDEWIRE ANG ZIPWIRE 038X150 (WIRE) IMPLANT
GUIDEWIRE STR DUAL SENSOR (WIRE) ×3 IMPLANT
IV NS IRRIG 3000ML ARTHROMATIC (IV SOLUTION) ×3 IMPLANT
KIT BALLIN UROMAX 15FX10 (LABEL) ×1 IMPLANT
KIT BALLN UROMAX 15FX4 (MISCELLANEOUS) IMPLANT
KIT BALLN UROMAX 26 75X4 (MISCELLANEOUS)
PACK CYSTO (CUSTOM PROCEDURE TRAY) ×3 IMPLANT
SET HIGH PRES BAL DIL (LABEL) ×2
SHEATH ACCESS URETERAL 38CM (SHEATH) IMPLANT
SHEATH ACCESS URETERAL 54CM (SHEATH) ×3 IMPLANT
STENT URET 6FRX26 CONTOUR (STENTS) ×3 IMPLANT

## 2015-03-16 NOTE — Anesthesia Preprocedure Evaluation (Addendum)
Anesthesia Evaluation  Patient identified by MRN, date of birth, ID band Patient awake    Reviewed: Allergy & Precautions, H&P , NPO status , Patient's Chart, lab work & pertinent test results  Airway Mallampati: III  TM Distance: >3 FB Neck ROM: Full    Dental  (+) Teeth Intact, Chipped, Dental Advisory Given,    Pulmonary Current Smoker,  breath sounds clear to auscultation  Pulmonary exam normal       Cardiovascular negative cardio ROS Normal cardiovascular examRhythm:Regular Rate:Normal     Neuro/Psych negative neurological ROS  negative psych ROS   GI/Hepatic negative GI ROS, Neg liver ROS,   Endo/Other  negative endocrine ROSMorbid obesity  Renal/GU negative Renal ROS  negative genitourinary   Musculoskeletal negative musculoskeletal ROS (+)   Abdominal (+) + obese,   Peds  Hematology negative hematology ROS (+)   Anesthesia Other Findings Pt grinds his teeth- wear noted  Reproductive/Obstetrics                           Anesthesia Physical Anesthesia Plan  ASA: III  Anesthesia Plan: General   Post-op Pain Management:    Induction: Intravenous  Airway Management Planned: LMA  Additional Equipment:   Intra-op Plan:   Post-operative Plan:   Informed Consent:   Plan Discussed with: Surgeon  Anesthesia Plan Comments:        Anesthesia Quick Evaluation

## 2015-03-16 NOTE — Interval H&P Note (Signed)
History and Physical Interval Note:  03/16/2015 9:08 AM  [name]  has presented today for surgery, with the diagnosis of RIGHT RENAL STONE  The various methods of treatment have been discussed with the patient and family. After consideration of risks, benefits and other options for treatment, the patient has consented to  Procedure(s): CYSTOSCOPY/URETEROSCOPY/STONE EXTRACTION/HOLMIUM LASER/STENT PLACEMENT (Right) as a surgical intervention .  The patient's history has been reviewed, patient examined, no change in status, stable for surgery.  I have reviewed the patient's chart and labs.  Questions were answered to the patient's satisfaction.     Elliett Guarisco J

## 2015-03-16 NOTE — Anesthesia Procedure Notes (Signed)
Procedure Name: LMA Insertion Date/Time: 03/16/2015 9:25 AM Performed by: Wanita Chamberlain Pre-anesthesia Checklist: Patient identified, Timeout performed, Emergency Drugs available, Suction available and Patient being monitored Patient Re-evaluated:Patient Re-evaluated prior to inductionOxygen Delivery Method: Circle system utilized Preoxygenation: Pre-oxygenation with 100% oxygen Intubation Type: IV induction Ventilation: Mask ventilation without difficulty LMA: LMA inserted LMA Size: 5.0 Number of attempts: 1 Airway Equipment and Method: Bite block Placement Confirmation: positive ETCO2 and breath sounds checked- equal and bilateral Tube secured with: Tape Dental Injury: Teeth and Oropharynx as per pre-operative assessment

## 2015-03-16 NOTE — Transfer of Care (Signed)
Immediate Anesthesia Transfer of Care Note  Patient: [name]  Procedure(s) Performed: Procedure(s): CYSTOSCOPY/URETEROSCOPY/STONE EXTRACTION/HOLMIUM LASER/STENT PLACEMENT (Right)  Patient Location: PACU  Anesthesia Type:General  Level of Consciousness: awake, alert , oriented and patient cooperative  Airway & Oxygen Therapy: Patient Spontanous Breathing and Patient connected to nasal cannula oxygen  Post-op Assessment: Report given to RN and Post -op Vital signs reviewed and stable  Post vital signs: Reviewed and stable  Last Vitals:  Filed Vitals:   03/16/15 0826  BP: 141/91  Pulse: 97  Temp: 36.9 C  Resp: 20    Complications: No apparent anesthesia complications

## 2015-03-16 NOTE — Brief Op Note (Signed)
03/16/2015  10:26 AM  PATIENT:  Louretta Parma  49 y.o. male  PRE-OPERATIVE DIAGNOSIS:  RIGHT RENAL STONE  POST-OPERATIVE DIAGNOSIS:  RIGHT RENAL STONE and BLADDER WALL LESION  PROCEDURE:  Procedure(s): CYSTOSCOPY/URETEROSCOPY/STONE EXTRACTION/HOLMIUM LASER/STENT PLACEMENT (Right)  BLADDER BIOPSY and FULGURATION  SURGEON:  Surgeon(s) and Role:    * Irine Seal, MD - Primary  PHYSICIAN ASSISTANT:   ASSISTANTS: none   ANESTHESIA:   general  EBL:     BLOOD ADMINISTERED:none  DRAINS: 6 x 26 right JJ stent   LOCAL MEDICATIONS USED:  NONE  SPECIMEN:  Source of Specimen:  stone fragments and bladder biopsy.  DISPOSITION OF SPECIMEN:  stones to family.   Bladder biopsy to path  COUNTS:  YES  TOURNIQUET:  * No tourniquets in log *  DICTATION: .Other Dictation: Dictation Number [id number]  PLAN OF CARE: Discharge to home after PACU  PATIENT DISPOSITION:  PACU - hemodynamically stable.   Delay start of Pharmacological VTE agent (>24hrs) due to surgical blood loss or risk of bleeding: not applicable

## 2015-03-16 NOTE — Discharge Instructions (Addendum)
CYSTOSCOPY HOME CARE INSTRUCTIONS  Activity: Rest for the remainder of the day.  Do not drive or operate equipment today.  You may resume normal activities in one to two days as instructed by your physician.   Meals: Drink plenty of liquids and eat light foods such as gelatin or soup this evening.  You may return to a normal meal plan tomorrow.  Return to Work: You may return to work in one to two days or as instructed by your physician.  Special Instructions / Symptoms: Call your physician if any of these symptoms occur:   -persistent or heavy bleeding  -bleeding which continues after first few urination  -large blood clots that are difficult to pass  -urine stream diminishes or stops completely  -fever equal to or higher than 101 degrees Farenheit.  -cloudy urine with a strong, foul odor  -severe pain  Females should always wipe from front to back after elimination.  You may feel some burning pain when you urinate.  This should disappear with time.  Applying moist heat to the lower abdomen or a hot tub bath may help relieve the pain. \  You may remove the stent by pulling the attached string on Monday 6/6 in the morning.   If you don't feel comfortable doing that please come to the office in the morning to have it removed.   Let us know if you start to leak urine as that might mean the stent is dislodged.     Post Anesthesia Home Care Instructions  Activity: Get plenty of rest for the remainder of the day. A responsible adult should stay with you for 24 hours following the procedure.  For the next 24 hours, DO NOT: -Drive a car -Paediatric nurse -Drink alcoholic beverages -Take any medication unless instructed by your physician -Make any legal decisions or sign important papers.  Meals: Start with liquid foods such as gelatin or soup. Progress to regular foods as tolerated. Avoid greasy, spicy, heavy foods. If nausea and/or vomiting occur, drink only clear liquids until the  nausea and/or vomiting subsides. Call your physician if vomiting continues.  Special Instructions/Symptoms: Your throat may feel dry or sore from the anesthesia or the breathing tube placed in your throat during surgery. If this causes discomfort, gargle with warm salt water. The discomfort should disappear within 24 hours.  If you had a scopolamine patch placed behind your ear for the management of post- operative nausea and/or vomiting:  1. The medication in the patch is effective for 72 hours, after which it should be removed.  Wrap patch in a tissue and discard in the trash. Wash hands thoroughly with soap and water. 2. You may remove the patch earlier than 72 hours if you experience unpleasant side effects which may include dry mouth, dizziness or visual disturbances. 3. Avoid touching the patch. Wash your hands with soap and water after contact with the patch.

## 2015-03-16 NOTE — Anesthesia Postprocedure Evaluation (Signed)
  Anesthesia Post-op Note  Patient: [name]  Procedure(s) Performed: Procedure(s) (LRB): CYSTOSCOPY/URETEROSCOPY/STONE EXTRACTION/HOLMIUM LASER/STENT PLACEMENT (Right)  Patient Location: PACU  Anesthesia Type: General  Level of Consciousness: awake and alert   Airway and Oxygen Therapy: Patient Spontanous Breathing  Post-op Pain: mild  Post-op Assessment: Post-op Vital signs reviewed, Patient's Cardiovascular Status Stable, Respiratory Function Stable, Patent Airway and No signs of Nausea or vomiting  Last Vitals:  Filed Vitals:   03/16/15 1145  BP: 124/78  Pulse: 75  Temp: 36.7 C  Resp: 16    Post-op Vital Signs: stable   Complications: No apparent anesthesia complications

## 2015-03-17 ENCOUNTER — Encounter (HOSPITAL_BASED_OUTPATIENT_CLINIC_OR_DEPARTMENT_OTHER): Payer: Self-pay | Admitting: Urology

## 2015-03-17 NOTE — Op Note (Signed)
NAMEMarland Avery  JOHNAVON, MCCLAFFERTY                 ACCOUNT NO.:  [id number]  MEDICAL RECORD NO.:  [id number]  LOCATION:                                 FACILITY:  PHYSICIAN:  Marshall Cork. Jeffie Pollock, M.D.    DATE OF BIRTH:  May 16, 1966  DATE OF PROCEDURE:  03/16/2015 DATE OF DISCHARGE:                              OPERATIVE REPORT   PROCEDURE: 1. Cystoscopy with right retrograde pyelogram and interpretation. 2. Right ureteroscopic stone extraction with holmium lasertripsy. 3. Insertion of right double-J stent. 4. Bladder biopsy and fulguration for a small bladder lesion.  PREOPERATIVE DIAGNOSIS:  An 8-mm right renal pelvic stone.  POSTOPERATIVE DIAGNOSIS:  An 8-mm right renal pelvic stone with small right bladder wall lesion.  SURGEON:  Marshall Cork. Jeffie Pollock, M.D.  ANESTHESIA:  General.  SPECIMEN:  Stone fragments and bladder biopsy.  DRAINS:  A 6-French by 26-cm double-J stent.  BLOOD LOSS:  Minimal.  COMPLICATIONS:  None.  INDICATIONS:  Mr. Montesinos is a 49 year old white male, who presented with hematuria, was found to have an 8-mm minimally radiopaque stone on CT scan.  It was not adequately seen on KUB.  He was felt to be a candidate for lithotripsy.  He has elected to proceed with ureteroscopy.  FINDINGS AND PROCEDURE:  He was given Cipro.  He was taken to the operating room, where general anesthetic was induced.  He was placed in lithotomy position and fitted with PAS hose.  His perineum and genitalia were prepped with Betadine solution and draped in usual sterile fashion.  Cystoscopy was performed using a 23-French scope and 30-degree lens. Examination revealed a normal urethra.  The external sphincter was intact.  Prostatic urethra was approximately 2 cm in length with bilobar hyperplasia with minimal obstruction.  Examination of bladder revealed mild trabeculation.  In general, the mucosa was smooth and pale without tumors or stones.  There was a small mucosal irregularity lateral to  the right ureteral orifice that was indeterminate in nature, but it was felt a biopsy would be indicated.  The ureteral orifices were otherwise unremarkable.  The right ureteral orifice was cannulated with 5-French open-ended catheter and contrast was instilled.  This revealed a normal ureter to the renal pelvis where there was a filling defect consistent with a stone seen on CT scan.  There was mild dilation of the collecting system.  A guidewire was then passed to the renal pelvis under fluoroscopic guidance and the cystoscope was removed.  A 55-cm digital access sheath inner core was then passed over the wire, but I was not able to advance beyond the mid ureter.  I then used a 10-cm 15-French high-pressure balloon dilation catheter to dilate the mid ureter sequentially in 2 inflations.  There was no significant waste that required disruption.  Once this dilation had been performed, the inner core passed to the kidney without difficulty.  The sheath was then assembled and inserted to the level of the renal pelvis.  The inner core and wire were then removed.  The dual-lumen digital flexible ureteroscope was then passed through the access sheath.  The stone was visualized in the renal pelvis, and it was actually pushed into  mid upper pole calyx.  A 200 micron TracTip laser fiber was then passed through the laser port. Saline was used as the irrigant.  The stone was engaged with the laser at 0.5 watts and 10 hertz, the frequency increased to 20 as the treatment progressed, the stone was then broken into small fragments.  A Nitinol ZeroTip basket was then used to remove the larger fragments, none of which were larger than 3-4 mm.  Some residual fragments remained in the bladder, but they were all 2 mm or less, and it was felt that the patient pass after period of stenting.  Once all significant fragments had been removed and the remaining factor fragments were too small to  reasonably basket.  The basket was removed and a guidewire was passed through the scope to the kidney.  The scope was then backed out over the wire with inspection of the entire ureter. In the mid ureter, there were some mucosal irritation, but no significant splitting of the ureteral wall.  Once the ureteroscope was removed, the cystoscope was reinserted over the wire and a 6-French 26-cm double-J stent with string was inserted over the wire to the kidney.  The wire was removed leaving good coil in the kidney and good coil in the bladder.  The bladder was then drained of saline and the irrigant was changed to sterile water.  The cystoscope was reinserted with a biopsy bridge and a single cup biopsy was obtained from the area of concern lateral to the right ureteral orifice.  Once the biopsy was complete, the biopsy site was fulgurated with a Bugbee electrode, and once hemostasis was achieved, the bladder was drained and the scope was removed with care being taken not to dislodge the stent.  Final fluoroscopic view revealed the stent in good position. The stent string was exiting urethra was secured to the patient's penis with pink tape.  A B and O suppository was then placed.  The drapes removed.  He was taken down from lithotomy position.  His anesthetic was reversed.  He was moved to recovery room in stable condition.  There were no complications.  The bladder biopsy was sent for pathology.  The stone fragments were given to the patient to bring to the office for analysis.     Marshall Cork. Jeffie Pollock, M.D.     JJW/MEDQ  D:  03/16/2015  T:  03/17/2015  Job:  [id number]

## 2016-08-07 ENCOUNTER — Ambulatory Visit: Payer: 59 | Admitting: Family Medicine

## 2016-08-07 ENCOUNTER — Ambulatory Visit (INDEPENDENT_AMBULATORY_CARE_PROVIDER_SITE_OTHER): Payer: 59 | Admitting: Family Medicine

## 2016-08-07 ENCOUNTER — Encounter: Payer: Self-pay | Admitting: Family Medicine

## 2016-08-07 VITALS — BP 141/89 | HR 96 | Ht 71.0 in | Wt 325.0 lb

## 2016-08-07 DIAGNOSIS — M25561 Pain in right knee: Secondary | ICD-10-CM | POA: Diagnosis not present

## 2016-08-07 MED ORDER — METHYLPREDNISOLONE ACETATE 40 MG/ML IJ SUSP
40.0000 mg | Freq: Once | INTRAMUSCULAR | Status: AC
  Administered 2016-08-07: 40 mg via INTRA_ARTICULAR

## 2016-08-07 NOTE — Patient Instructions (Signed)
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 These are the different classes of medicine you can take for this: Tylenol 500mg  1-2 tabs three times a day for pain. Aleve 1-2 tabs twice a day with food Glucosamine sulfate 750mg  twice a day is a supplement that may help. Capsaicin, aspercreme, or biofreeze topically up to four times a day may also help with pain. Cortisone injections are an option - you were given this today.. If cortisone injections do not help, there are different types of shots that may help but they take longer to take effect. It's important that you continue to stay active. Straight leg raises, knee extensions 3 sets of 10 once a day (add ankle weight if these become too easy). Consider physical therapy to strengthen muscles around the joint that hurts to take pressure off of the joint itself. Shoe inserts with good arch support may be helpful. Walker or cane if needed. Heat or ice 15 minutes at a time 3-4 times a day as needed to help with pain. Follow up with me in 1 month or as needed.

## 2016-08-09 DIAGNOSIS — M25561 Pain in right knee: Secondary | ICD-10-CM | POA: Insufficient documentation

## 2016-08-09 NOTE — Assessment & Plan Note (Signed)
2/2 flare of arthritis.  Discussed tylenol, aleve, glucosamine, topical medications.  Aspiration/injection performed today.  Shown home exercises to do daily.  Heat/ice, arch supports discussed also.  After informed written consent patient was lying supine on exam table.  Right knee was prepped with alcohol swab.  Utilizing superolateral approach, 3 mL of marcaine was used for local anesthesia.  Then using an 18g needle on 60cc syringe, 58 mL of clear straw-colored fluid was aspirated from right knee.  Knee was then injected with 3:1 marcaine:depomedrol.  Patient tolerated procedure well without immediate complications

## 2016-08-09 NOTE — Progress Notes (Signed)
PCP: No primary care provider on file.  Subjective:   HPI: Patient is a 50 y.o. male here for right knee injury.  Patient reports on 9/16 he was dancing at his daughter's wedding. No obvious injury though he was doing squatting while dancing. Had muscle soreness after this but 2 days later right knee was hurting. Pain is a constant ache, worse with bending. Pain level 3/10 anterior knee. No locking or catching. Bothers to straighten all the way too. No skin changes, numbness.  Past Medical History:  Diagnosis Date  . History of kidney stones   . Renal calculus, right     Current Outpatient Prescriptions on File Prior to Visit  Medication Sig Dispense Refill  . HYDROcodone-acetaminophen (NORCO/VICODIN) 5-325 MG per tablet Take 1 tablet by mouth every 6 (six) hours as needed for moderate pain.    . hyoscyamine (LEVSIN/SL) 0.125 MG SL tablet Place 1 tablet (0.125 mg total) under the tongue every 4 (four) hours as needed for cramping. 20 tablet 0  . Multiple Vitamin (MULTIVITAMIN) tablet Take 1 tablet by mouth daily.    . Omega-3 Fatty Acids (FISH OIL) 1000 MG CAPS Take 2 capsules by mouth daily.    . ondansetron (ZOFRAN ODT) 4 MG disintegrating tablet Take 1 tablet (4 mg total) by mouth every 8 (eight) hours as needed for nausea or vomiting. 20 tablet 0  . oxyCODONE (ROXICODONE) 5 MG immediate release tablet Take 1 tablet (5 mg total) by mouth every 4 (four) hours as needed for severe pain. 30 tablet 0  . phenazopyridine (PYRIDIUM) 200 MG tablet Take 1 tablet (200 mg total) by mouth 3 (three) times daily as needed for pain. 15 tablet 1   No current facility-administered medications on file prior to visit.     Past Surgical History:  Procedure Laterality Date  . CYSTOSCOPY WITH URETEROSCOPY AND STENT PLACEMENT Left 05/18/2013   Procedure: LEFT URETEROSCOPY, Retrograde Pyelogram, Left Ureteral Dilation,STONE EXTRACTION, STENT PLACEMENT ;  Surgeon: Malka So, MD;  Location: [facility];  Service: Urology;  Laterality: Left;  . CYSTOSCOPY/URETEROSCOPY/HOLMIUM LASER/STENT PLACEMENT Right 03/16/2015   Procedure: CYSTOSCOPY/URETEROSCOPY/STONE EXTRACTION/HOLMIUM LASER/STENT PLACEMENT;  Surgeon: Irine Seal, MD;  Location: [facility];  Service: Urology;  Laterality: Right;  . HOLMIUM LASER APPLICATION Left [id number]   Procedure: HOLMIUM LASER APPLICATION;  Surgeon: Malka So, MD;  Location: [facility];  Service: Urology;  Laterality: Left;  . ORIF LEFT PROXIMAL TIBIAL FX  2006  . VASECTOMY Bilateral 05/18/2013   Procedure: BILATERAL VASECTOMY;  Surgeon: Malka So, MD;  Location: [facility];  Service: Urology;  Laterality: Bilateral;    No Known Allergies  Social History   Social History  . Marital status: Married    Spouse name: N/A  . Number of children: N/A  . Years of education: N/A   Occupational History  . Not on file.   Social History Main Topics  . Smoking status: Current Every Day Smoker    Packs/day: 1.00    Years: 26.00    Types: Cigarettes  . Smokeless tobacco: Never Used  . Alcohol use 10.5 oz/week    21 Standard drinks or equivalent per week     Comment: 3 DRINKS DAILY  . Drug use: No  . Sexual activity: Not on file   Other Topics Concern  . Not on file   Social History Narrative  . No narrative on file    No family history on file.  BP (!) 141/89   Pulse 96   Ht 5\' 11"  (1.803 m)   Wt (!) 325 lb (147.4 kg)   BMI 45.33 kg/m   Review of Systems: See HPI above.    Objective:  Physical Exam:  Gen: NAD, comfortable in exam room  Right knee: No gross deformity, ecchymoses.  Mod effusion. TTP post patellar facets, medial joint line. ROM 0 - 120 degrees. Negative ant/post drawers. Negative valgus/varus testing. Negative lachmanns. Negative mcmurrays, apleys, patellar apprehension. NV intact distally.  Left knee: FROM without pain.    Assessment & Plan:  1. Right  knee pain - 2/2 flare of arthritis.  Discussed tylenol, aleve, glucosamine, topical medications.  Aspiration/injection performed today.  Shown home exercises to do daily.  Heat/ice, arch supports discussed also.  After informed written consent patient was lying supine on exam table.  Right knee was prepped with alcohol swab.  Utilizing superolateral approach, 3 mL of marcaine was used for local anesthesia.  Then using an 18g needle on 60cc syringe, 58 mL of clear straw-colored fluid was aspirated from right knee.  Knee was then injected with 3:1 marcaine:depomedrol.  Patient tolerated procedure well without immediate complications

## 2016-09-10 ENCOUNTER — Ambulatory Visit (HOSPITAL_BASED_OUTPATIENT_CLINIC_OR_DEPARTMENT_OTHER)
Admission: RE | Admit: 2016-09-10 | Discharge: 2016-09-10 | Disposition: A | Discharge status: Home / Self Care | Payer: 59 | Source: Ambulatory Visit | Attending: Family Medicine | Admitting: Family Medicine

## 2016-09-10 ENCOUNTER — Ambulatory Visit (INDEPENDENT_AMBULATORY_CARE_PROVIDER_SITE_OTHER): Payer: 59 | Admitting: Family Medicine

## 2016-09-10 ENCOUNTER — Encounter: Payer: Self-pay | Admitting: Family Medicine

## 2016-09-10 VITALS — BP 151/98 | HR 91 | Ht 71.0 in | Wt 325.0 lb

## 2016-09-10 DIAGNOSIS — M25461 Effusion, right knee: Secondary | ICD-10-CM | POA: Insufficient documentation

## 2016-09-10 DIAGNOSIS — M25561 Pain in right knee: Secondary | ICD-10-CM

## 2016-09-10 DIAGNOSIS — M7989 Other specified soft tissue disorders: Secondary | ICD-10-CM | POA: Diagnosis not present

## 2016-09-10 MED ORDER — METHYLPREDNISOLONE ACETATE 40 MG/ML IJ SUSP
40.0000 mg | Freq: Once | INTRAMUSCULAR | Status: AC
  Administered 2016-09-10: 40 mg via INTRA_ARTICULAR

## 2016-09-11 NOTE — Progress Notes (Addendum)
PCP: No primary care provider on file.  Subjective:   HPI: Patient is a 50 y.o. male here for right knee injury.  Patient reports on 9/16 he was dancing at his daughter's wedding. No obvious injury though he was doing squatting while dancing. Had muscle soreness after this but 2 days later right knee was hurting. Pain is a constant ache, worse with bending. Pain level 3/10 anterior knee. No locking or catching. Bothers to straighten all the way too. No skin changes, numbness.  Past Medical History:  Diagnosis Date  . History of kidney stones   . Renal calculus, right     Current Outpatient Prescriptions on File Prior to Visit  Medication Sig Dispense Refill  . HYDROcodone-acetaminophen (NORCO/VICODIN) 5-325 MG per tablet Take 1 tablet by mouth every 6 (six) hours as needed for moderate pain.    . hyoscyamine (LEVSIN/SL) 0.125 MG SL tablet Place 1 tablet (0.125 mg total) under the tongue every 4 (four) hours as needed for cramping. 20 tablet 0  . Multiple Vitamin (MULTIVITAMIN) tablet Take 1 tablet by mouth daily.    . Omega-3 Fatty Acids (FISH OIL) 1000 MG CAPS Take 2 capsules by mouth daily.    . ondansetron (ZOFRAN ODT) 4 MG disintegrating tablet Take 1 tablet (4 mg total) by mouth every 8 (eight) hours as needed for nausea or vomiting. 20 tablet 0  . oxyCODONE (ROXICODONE) 5 MG immediate release tablet Take 1 tablet (5 mg total) by mouth every 4 (four) hours as needed for severe pain. 30 tablet 0  . phenazopyridine (PYRIDIUM) 200 MG tablet Take 1 tablet (200 mg total) by mouth 3 (three) times daily as needed for pain. 15 tablet 1   No current facility-administered medications on file prior to visit.     Past Surgical History:  Procedure Laterality Date  . CYSTOSCOPY WITH URETEROSCOPY AND STENT PLACEMENT Left 05/18/2013   Procedure: LEFT URETEROSCOPY, Retrograde Pyelogram, Left Ureteral Dilation,STONE EXTRACTION, STENT PLACEMENT ;  Surgeon: Malka So, MD;  Location: [facility];  Service: Urology;  Laterality: Left;  . CYSTOSCOPY/URETEROSCOPY/HOLMIUM LASER/STENT PLACEMENT Right 03/16/2015   Procedure: CYSTOSCOPY/URETEROSCOPY/STONE EXTRACTION/HOLMIUM LASER/STENT PLACEMENT;  Surgeon: Irine Seal, MD;  Location: [facility];  Service: Urology;  Laterality: Right;  . HOLMIUM LASER APPLICATION Left [id number]   Procedure: HOLMIUM LASER APPLICATION;  Surgeon: Malka So, MD;  Location: [facility];  Service: Urology;  Laterality: Left;  . ORIF LEFT PROXIMAL TIBIAL FX  2006  . VASECTOMY Bilateral 05/18/2013   Procedure: BILATERAL VASECTOMY;  Surgeon: Malka So, MD;  Location: [facility];  Service: Urology;  Laterality: Bilateral;    No Known Allergies  Social History   Social History  . Marital status: Married    Spouse name: N/A  . Number of children: N/A  . Years of education: N/A   Occupational History  . Not on file.   Social History Main Topics  . Smoking status: Current Every Day Smoker    Packs/day: 1.00    Years: 26.00    Types: Cigarettes  . Smokeless tobacco: Never Used  . Alcohol use 10.5 oz/week    21 Standard drinks or equivalent per week     Comment: 3 DRINKS DAILY  . Drug use: No  . Sexual activity: Not on file   Other Topics Concern  . Not on file   Social History Narrative  . No narrative on file    No family history on file.  BP (!) 151/98   Pulse 91   Ht 5\' 11"  (1.803 m)   Wt (!) 325 lb (147.4 kg)   BMI 45.33 kg/m   Review of Systems: See HPI above.    Objective:  Physical Exam:  Gen: NAD, comfortable in exam room  Right knee: No gross deformity, ecchymoses.  Mod effusion. TTP post patellar facets, medial joint line. ROM 0 - 120 degrees. Negative ant/post drawers. Negative valgus/varus testing. Negative lachmanns. Negative mcmurrays, apleys, patellar apprehension. NV intact distally.  Left knee: FROM without pain.    Assessment & Plan:  1. Right  knee pain - Unfortunately he did not improve as expected following aspiration and injection of the knee last visit.  Independently reviewed radiographs showing surprisingly only a little arthritis medially- brings up concern over possible meniscus tear with large effusion. We will try repeat aspiration and injection today.  He is to call us in a week to let us know how he's doing.  Consider MRI if not improving.  Continue home exercises, heat/ice, arch supports.  After informed written consent patient was lying supine on exam table.  Right knee was prepped with alcohol swab.  Utilizing superolateral approach, 3 mL of marcaine was used for local anesthesia.  Then using an 18g needle on 60cc syringe, 61 mL of clear straw-colored fluid was aspirated from right knee.  Knee was then injected with 3:1 marcaine:depomedrol.  Patient tolerated procedure well without immediate complications  Addendum:  MRI reviewed and discussed with patient.  He does have a couple medial meniscus tears.  Will go ahead with ortho referral to discuss arthroscopy as not improving with conservative measures to date.

## 2016-09-11 NOTE — Assessment & Plan Note (Signed)
Unfortunately he did not improve as expected following aspiration and injection of the knee last visit.  Independently reviewed radiographs showing surprisingly only a little arthritis medially- brings up concern over possible meniscus tear with large effusion. We will try repeat aspiration and injection today.  He is to call us in a week to let us know how he's doing.  Consider MRI if not improving.  Continue home exercises, heat/ice, arch supports.  After informed written consent patient was lying supine on exam table.  Right knee was prepped with alcohol swab.  Utilizing superolateral approach, 3 mL of marcaine was used for local anesthesia.  Then using an 18g needle on 60cc syringe, 61 mL of clear straw-colored fluid was aspirated from right knee.  Knee was then injected with 3:1 marcaine:depomedrol.  Patient tolerated procedure well without immediate complications

## 2016-09-13 HISTORY — PX: KNEE ARTHROSCOPY: SUR90

## 2016-09-17 ENCOUNTER — Telehealth: Payer: Self-pay | Admitting: Family Medicine

## 2016-09-17 NOTE — Telephone Encounter (Signed)
Spoke to patient and gave him information provided by the physician. Patient is going to give it more time.

## 2016-09-17 NOTE — Telephone Encounter (Signed)
Nevin Bloodgood - can you check with him quick and see what he needs?  Thanks!

## 2016-09-17 NOTE — Telephone Encounter (Signed)
I wouldn't expect his pain to be completely gone at this point.  But if he has improved and is happy with his progress to date from a week ago I would tell him to give it more time.  If he's struggling to do what he wants to do still and would consider surgery at this point, I would recommend doing the MRI.

## 2016-09-17 NOTE — Telephone Encounter (Signed)
Unable to contact. Left message to call back.

## 2016-09-17 NOTE — Telephone Encounter (Signed)
Spoke to patient and he stated that there is still a pain that did not use to be there. Also said that his knee is much better. Wants to talk to you about if he should do the MRI or wait a while longer. Please call when you get a chance. Thanks.

## 2016-09-23 ENCOUNTER — Telehealth: Payer: Self-pay | Admitting: Family Medicine

## 2016-09-23 MED ORDER — OXYCODONE HCL 5 MG PO TABS: 5.0000 mg | ORAL_TABLET | Freq: Four times a day (QID) | ORAL | 0 refills | Status: DC | PRN

## 2016-09-23 NOTE — Telephone Encounter (Signed)
Printed for patient

## 2016-09-23 NOTE — Addendum Note (Signed)
Addended by: Sherrie George F on: 09/23/2016 10:47 AM   Modules accepted: Orders

## 2016-09-23 NOTE — Telephone Encounter (Signed)
Yes this is fine if he would like a short course of hydrocodone or oxycodone.  Please let me know.  Thanks!

## 2016-09-23 NOTE — Telephone Encounter (Signed)
Yes please go ahead with this - if we can get it during the week sometime that would be ideal.  Thanks!

## 2016-09-23 NOTE — Telephone Encounter (Signed)
Order placed and contacted [facility] in [place]. They will contact patient to set up appointment time for this Thursday (09-26-16) for MRI.

## 2016-09-23 NOTE — Telephone Encounter (Signed)
Spoke to patient and he wants to do a short course of the oxycodone. Thanks.

## 2016-09-26 ENCOUNTER — Ambulatory Visit (INDEPENDENT_AMBULATORY_CARE_PROVIDER_SITE_OTHER): Payer: 59

## 2016-09-26 DIAGNOSIS — S83241D Other tear of medial meniscus, current injury, right knee, subsequent encounter: Secondary | ICD-10-CM | POA: Diagnosis not present

## 2016-09-26 DIAGNOSIS — X58XXXD Exposure to other specified factors, subsequent encounter: Secondary | ICD-10-CM | POA: Diagnosis not present

## 2016-09-26 DIAGNOSIS — M25561 Pain in right knee: Secondary | ICD-10-CM

## 2016-09-27 ENCOUNTER — Telehealth: Payer: Self-pay | Admitting: Family Medicine

## 2016-09-27 NOTE — Addendum Note (Signed)
Addended by: Sherrie George F on: 09/27/2016 12:43 PM   Modules accepted: Orders

## 2016-09-30 ENCOUNTER — Ambulatory Visit (INDEPENDENT_AMBULATORY_CARE_PROVIDER_SITE_OTHER): Payer: 59 | Admitting: Orthopaedic Surgery

## 2016-09-30 DIAGNOSIS — M23321 Other meniscus derangements, posterior horn of medial meniscus, right knee: Secondary | ICD-10-CM | POA: Diagnosis not present

## 2016-09-30 NOTE — Progress Notes (Signed)
Office Visit Note   Patient: [name]           Date of Birth: [birth year]           MRN: [id number] Visit Date: 09/30/2016              Requested by: No referring provider defined for this encounter. PCP: No PCP Per Patient   Assessment & Plan: Visit Diagnoses:  1. Other meniscus derangements, posterior horn of medial meniscus, right knee     Plan: I showed him a knee model and talked him in detail about his MRI. I do feel a right knee arthroscopy with partial medial meniscectomy as well as assessing the medial compartment his knee is warranted at this point. He does wish to have this well due to his continued knee pain, his recurrent effusions, and his locking catching. I explained in detail the risk medicine surgery and what this involves. We'll work on getting this set up soon and that we would see him back in 1 week postoperative.  Follow-Up Instructions: Return for 1 week post-op.   Orders:  No orders of the defined types were placed in this encounter.  No orders of the defined types were placed in this encounter.     Procedures: No procedures performed   Clinical Data: No additional findings.   Subjective: No chief complaint on file. He comes in today with chief complaint of right knee pain. He is referred from Dr. Karlton Lemon from the sports medicine apartment here at [place]. He has an MRI that accompanied him as well. He had a little bit of knee pain here and there but after his daughter's wedding he had a twisting injury of the knee and he's had pain along the medial compartment of his knee. He's had at least 2 aspirations of his knee as well as a steroid injection. He does report medial joint line tenderness as well as locking catching and pain on the medial compartment of his knee with pivoting types of activities. MRI shows a meniscal tear of the medial meniscus.  HPI  Review of Systems He denies any headache, shortness breath, chest pain,  fever, chills, nausea, vomiting.  Objective: Vital Signs: There were no vitals taken for this visit.  Physical Exam He is alert and oriented 3 Ortho Exam His right knee has a painful effusion. There is medial joint line tenderness and a positive Murray sign to the medial side. Specialty Comments:  No specialty comments available.  Imaging: No results found. The MRI of his right knee shows a complex medial meniscal tear. Involves the posterior horn and the mid body. It is acute and degenerative in nature. He has areas of significant cartilage abnormalities on the medial compartment of his knee as well.  PMFS History: Patient Active Problem List   Diagnosis Date Noted  . Right knee pain 08/09/2016   Past Medical History:  Diagnosis Date  . History of kidney stones   . Renal calculus, right     No family history on file.  Past Surgical History:  Procedure Laterality Date  . CYSTOSCOPY WITH URETEROSCOPY AND STENT PLACEMENT Left 05/18/2013   Procedure: LEFT URETEROSCOPY, Retrograde Pyelogram, Left Ureteral Dilation,STONE EXTRACTION, STENT PLACEMENT ;  Surgeon: Malka So, MD;  Location: [facility];  Service: Urology;  Laterality: Left;  . CYSTOSCOPY/URETEROSCOPY/HOLMIUM LASER/STENT PLACEMENT Right 03/16/2015   Procedure: CYSTOSCOPY/URETEROSCOPY/STONE EXTRACTION/HOLMIUM LASER/STENT PLACEMENT;  Surgeon: Irine Seal, MD;  Location: [facility];  Service: Urology;  Laterality: Right;  . HOLMIUM LASER APPLICATION Left [id number]   Procedure: HOLMIUM LASER APPLICATION;  Surgeon: Malka So, MD;  Location: [facility];  Service: Urology;  Laterality: Left;  . ORIF LEFT PROXIMAL TIBIAL FX  2006  . VASECTOMY Bilateral 05/18/2013   Procedure: BILATERAL VASECTOMY;  Surgeon: Malka So, MD;  Location: [facility];  Service: Urology;  Laterality: Bilateral;   Social History   Occupational History  . Not on file.   Social History  Main Topics  . Smoking status: Current Every Day Smoker    Packs/day: 1.00    Years: 26.00    Types: Cigarettes  . Smokeless tobacco: Never Used  . Alcohol use 10.5 oz/week    21 Standard drinks or equivalent per week     Comment: 3 DRINKS DAILY  . Drug use: No  . Sexual activity: Not on file

## 2016-09-30 NOTE — Telephone Encounter (Signed)
I spoke with him on Friday - will put addendum to his note.

## 2016-10-03 ENCOUNTER — Telehealth: Payer: Self-pay | Admitting: Family Medicine

## 2016-10-03 ENCOUNTER — Telehealth (INDEPENDENT_AMBULATORY_CARE_PROVIDER_SITE_OTHER): Payer: Self-pay

## 2016-10-03 MED ORDER — HYDROCODONE-ACETAMINOPHEN 5-325 MG PO TABS: 1.0000 | ORAL_TABLET | Freq: Three times a day (TID) | ORAL | 0 refills | Status: DC | PRN

## 2016-10-03 NOTE — Telephone Encounter (Signed)
Can come and pick up script 

## 2016-10-03 NOTE — Telephone Encounter (Signed)
Spoke to patient and he stated that he contacted the surgeon's office about what to take prior to surgery.

## 2016-10-03 NOTE — Telephone Encounter (Signed)
Surgery will be 10-10-16.  Needs refill on pain medication.

## 2016-10-03 NOTE — Telephone Encounter (Signed)
He should contact the surgeon to discuss at this point and what he can take prior to surgery.

## 2016-10-04 NOTE — Telephone Encounter (Signed)
I called patient and advised medication at front desk for pick up.  I also put surgery information with Rx.

## 2016-10-17 ENCOUNTER — Inpatient Hospital Stay (INDEPENDENT_AMBULATORY_CARE_PROVIDER_SITE_OTHER): Payer: Self-pay | Admitting: Orthopaedic Surgery

## 2016-10-17 ENCOUNTER — Encounter: Payer: Self-pay | Admitting: Orthopaedic Surgery

## 2016-10-17 DIAGNOSIS — M23331 Other meniscus derangements, other medial meniscus, right knee: Secondary | ICD-10-CM | POA: Diagnosis not present

## 2016-10-24 ENCOUNTER — Ambulatory Visit (INDEPENDENT_AMBULATORY_CARE_PROVIDER_SITE_OTHER): Payer: 59 | Admitting: Orthopaedic Surgery

## 2016-10-24 ENCOUNTER — Encounter (INDEPENDENT_AMBULATORY_CARE_PROVIDER_SITE_OTHER): Payer: Self-pay | Admitting: Orthopaedic Surgery

## 2016-10-24 DIAGNOSIS — M23321 Other meniscus derangements, posterior horn of medial meniscus, right knee: Secondary | ICD-10-CM

## 2016-10-24 NOTE — Progress Notes (Signed)
The patient is just one week status post a right knee arthroscopy. We performed a partial medial meniscectomy. He had quite a significant medial meniscal tear and grade 3 cartilage changes in the medial femoral condyle. He says that he woke up from surgery feeling great and he's felt great since then he has really no issues.  On examination of his knee both suture sites are good and remove the sutures. There is a moderate effusion but good range of motion of his knee.  He is able to demonstrate back quad strengthening exercises. We talked about weight loss. Also talked about hyaluronic acid now I could help his knee. I gave him a handout about this as well. He says he is doing so well he'll call and let us know if he has any issues or needs anything else done. He'll otherwise follow up as needed.

## 2016-10-30 ENCOUNTER — Inpatient Hospital Stay (INDEPENDENT_AMBULATORY_CARE_PROVIDER_SITE_OTHER): Payer: Self-pay | Admitting: Orthopaedic Surgery

## 2016-10-31 ENCOUNTER — Inpatient Hospital Stay (INDEPENDENT_AMBULATORY_CARE_PROVIDER_SITE_OTHER): Payer: Self-pay | Admitting: Orthopaedic Surgery

## 2017-02-14 ENCOUNTER — Other Ambulatory Visit: Payer: Self-pay | Admitting: Urology

## 2017-02-18 ENCOUNTER — Encounter (HOSPITAL_COMMUNITY): Payer: Self-pay | Admitting: *Deleted

## 2017-02-20 ENCOUNTER — Encounter (HOSPITAL_COMMUNITY): Payer: Self-pay | Admitting: General Practice

## 2017-02-20 ENCOUNTER — Ambulatory Visit (HOSPITAL_COMMUNITY)
Admission: RE | Admit: 2017-02-20 | Discharge: 2017-02-20 | Disposition: A | Discharge status: Home / Self Care | Payer: 59 | Source: Ambulatory Visit | Attending: Urology | Admitting: Urology

## 2017-02-20 ENCOUNTER — Ambulatory Visit (HOSPITAL_COMMUNITY): Payer: 59

## 2017-02-20 ENCOUNTER — Encounter (HOSPITAL_COMMUNITY)
Admission: RE | Disposition: A | Discharge status: Home / Self Care | Payer: Self-pay | Source: Ambulatory Visit | Attending: Urology

## 2017-02-20 DIAGNOSIS — E669 Obesity, unspecified: Secondary | ICD-10-CM | POA: Diagnosis not present

## 2017-02-20 DIAGNOSIS — N201 Calculus of ureter: Secondary | ICD-10-CM | POA: Insufficient documentation

## 2017-02-20 DIAGNOSIS — Z6841 Body Mass Index (BMI) 40.0 and over, adult: Secondary | ICD-10-CM | POA: Insufficient documentation

## 2017-02-20 DIAGNOSIS — F17210 Nicotine dependence, cigarettes, uncomplicated: Secondary | ICD-10-CM | POA: Insufficient documentation

## 2017-02-20 HISTORY — DX: Cardiac murmur, unspecified: R01.1

## 2017-02-20 HISTORY — PX: EXTRACORPOREAL SHOCK WAVE LITHOTRIPSY: SHX1557

## 2017-02-20 SURGERY — LITHOTRIPSY, ESWL
Anesthesia: LOCAL | Laterality: Right

## 2017-02-20 MED ORDER — DIAZEPAM 5 MG PO TABS
10.0000 mg | ORAL_TABLET | ORAL | Status: AC
  Administered 2017-02-20: 10 mg via ORAL
  Filled 2017-02-20: qty 2

## 2017-02-20 MED ORDER — DIPHENHYDRAMINE HCL 25 MG PO CAPS
25.0000 mg | ORAL_CAPSULE | ORAL | Status: AC
  Administered 2017-02-20: 25 mg via ORAL
  Filled 2017-02-20: qty 1

## 2017-02-20 MED ORDER — CIPROFLOXACIN HCL 500 MG PO TABS
500.0000 mg | ORAL_TABLET | ORAL | Status: AC
  Administered 2017-02-20: 500 mg via ORAL
  Filled 2017-02-20: qty 1

## 2017-02-20 MED ORDER — SODIUM CHLORIDE 0.9 % IV SOLN
INTRAVENOUS | Status: DC
  Administered 2017-02-20: 07:00:00 via INTRAVENOUS

## 2017-02-20 NOTE — Discharge Instructions (Signed)
Lithotripsy, Care After °This sheet gives you information about how to care for yourself after your procedure. Your health care provider may also give you more specific instructions. If you have problems or questions, contact your health care provider. °What can I expect after the procedure? °After the procedure, it is common to have: °· Some blood in your urine. This should only last for a few days. °· Soreness in your back, sides, or upper abdomen for a few days. °· Blotches or bruises on your back where the pressure wave entered the skin. °· Pain, discomfort, or nausea when pieces (fragments) of the kidney stone move through the tube that carries urine from the kidney to the bladder (ureter). Stone fragments may pass soon after the procedure, but they may continue to pass for up to 4-8 weeks. °? If you have severe pain or nausea, contact your health care provider. This may be caused by a large stone that was not broken up, and this may mean that you need more treatment. °· Some pain or discomfort during urination. °· Some pain or discomfort in the lower abdomen or (in men) at the base of the penis. ° °Follow these instructions at home: °Medicines °· Take over-the-counter and prescription medicines only as told by your health care provider. °· If you were prescribed an antibiotic medicine, take it as told by your health care provider. Do not stop taking the antibiotic even if you start to feel better. °· Do not drive for 24 hours if you were given a medicine to help you relax (sedative). °· Do not drive or use heavy machinery while taking prescription pain medicine. °Eating and drinking °· Drink enough water and fluids to keep your urine clear or pale yellow. This helps any remaining pieces of the stone to pass. It can also help prevent new stones from forming. °· Eat plenty of fresh fruits and vegetables. °· Follow instructions from your health care provider about eating and drinking restrictions. You may be  instructed: °? To reduce how much salt (sodium) you eat or drink. Check ingredients and nutrition facts on packaged foods and beverages. °? To reduce how much meat you eat. °· Eat the recommended amount of calcium for your age and gender. Ask your health care provider how much calcium you should have. °General instructions °· Get plenty of rest. °· Most people can resume normal activities 1-2 days after the procedure. Ask your health care provider what activities are safe for you. °· If directed, strain all urine through the strainer that was provided by your health care provider. °? Keep all fragments for your health care provider to see. Any stones that are found may be sent to a medical lab for examination. The stone may be as small as a grain of salt. °· Keep all follow-up visits as told by your health care provider. This is important. °Contact a health care provider if: °· You have pain that is severe or does not get better with medicine. °· You have nausea that is severe or does not go away. °· You have blood in your urine longer than your health care provider told you to expect. °· You have more blood in your urine. °· You have pain during urination that does not go away. °· You urinate more frequently than usual and this does not go away. °· You develop a rash or any other possible signs of an allergic reaction. °Get help right away if: °· You have severe pain in   your back, sides, or upper abdomen. °· You have severe pain while urinating. °· Your urine is very dark red. °· You have blood in your stool (feces). °· You cannot pass any urine at all. °· You feel a strong urge to urinate after emptying your bladder. °· You have a fever or chills. °· You develop shortness of breath, difficulty breathing, or chest pain. °· You have severe nausea that leads to persistent vomiting. °· You faint. °Summary °· After this procedure, it is common to have some pain, discomfort, or nausea when pieces (fragments) of the  kidney stone move through the tube that carries urine from the kidney to the bladder (ureter). If this pain or nausea is severe, however, you should contact your health care provider. °· Most people can resume normal activities 1-2 days after the procedure. Ask your health care provider what activities are safe for you. °· Drink enough water and fluids to keep your urine clear or pale yellow. This helps any remaining pieces of the stone to pass, and it can help prevent new stones from forming. °· If directed, strain your urine and keep all fragments for your health care provider to see. Fragments or stones may be as small as a grain of salt. °· Get help right away if you have severe pain in your back, sides, or upper abdomen or have severe pain while urinating. °This information is not intended to replace advice given to you by your health care provider. Make sure you discuss any questions you have with your health care provider. °Document Released: 10/20/2007 Document Revised: 08/21/2016 Document Reviewed: 08/21/2016 °Elsevier Interactive Patient Education © 2017 [employer] Inc. ° °

## 2017-02-21 ENCOUNTER — Encounter (HOSPITAL_COMMUNITY): Payer: Self-pay | Admitting: Urology

## 2018-01-19 ENCOUNTER — Other Ambulatory Visit: Payer: Self-pay

## 2018-01-19 ENCOUNTER — Emergency Department (HOSPITAL_BASED_OUTPATIENT_CLINIC_OR_DEPARTMENT_OTHER)
Admission: EM | Admit: 2018-01-19 | Discharge: 2018-01-19 | Disposition: A | Discharge status: Home / Self Care | Payer: 59 | Attending: Emergency Medicine | Admitting: Emergency Medicine

## 2018-01-19 ENCOUNTER — Encounter (HOSPITAL_BASED_OUTPATIENT_CLINIC_OR_DEPARTMENT_OTHER): Payer: Self-pay | Admitting: Emergency Medicine

## 2018-01-19 DIAGNOSIS — Y9389 Activity, other specified: Secondary | ICD-10-CM | POA: Insufficient documentation

## 2018-01-19 DIAGNOSIS — S61052A Open bite of left thumb without damage to nail, initial encounter: Secondary | ICD-10-CM | POA: Diagnosis not present

## 2018-01-19 DIAGNOSIS — S60372A Other superficial bite of left thumb, initial encounter: Secondary | ICD-10-CM | POA: Diagnosis not present

## 2018-01-19 DIAGNOSIS — W540XXA Bitten by dog, initial encounter: Secondary | ICD-10-CM | POA: Diagnosis not present

## 2018-01-19 DIAGNOSIS — Y92810 Car as the place of occurrence of the external cause: Secondary | ICD-10-CM | POA: Insufficient documentation

## 2018-01-19 DIAGNOSIS — Z2914 Encounter for prophylactic rabies immune globin: Secondary | ICD-10-CM | POA: Diagnosis not present

## 2018-01-19 DIAGNOSIS — Z203 Contact with and (suspected) exposure to rabies: Secondary | ICD-10-CM | POA: Diagnosis not present

## 2018-01-19 DIAGNOSIS — S61452A Open bite of left hand, initial encounter: Secondary | ICD-10-CM | POA: Diagnosis not present

## 2018-01-19 DIAGNOSIS — Y998 Other external cause status: Secondary | ICD-10-CM | POA: Insufficient documentation

## 2018-01-19 DIAGNOSIS — Z23 Encounter for immunization: Secondary | ICD-10-CM | POA: Insufficient documentation

## 2018-01-19 MED ORDER — TETANUS-DIPHTH-ACELL PERTUSSIS 5-2.5-18.5 LF-MCG/0.5 IM SUSP
0.5000 mL | Freq: Once | INTRAMUSCULAR | Status: AC
  Administered 2018-01-19: 0.5 mL via INTRAMUSCULAR
  Filled 2018-01-19: qty 0.5

## 2018-01-19 MED ORDER — RABIES IMMUNE GLOBULIN 150 UNIT/ML IM INJ
20.0000 [IU]/kg | INJECTION | Freq: Once | INTRAMUSCULAR | Status: AC
  Administered 2018-01-19: 2775 [IU]
  Filled 2018-01-19: qty 20

## 2018-01-19 MED ORDER — RABIES VACCINE, PCEC IM SUSR
1.0000 mL | Freq: Once | INTRAMUSCULAR | Status: AC
  Administered 2018-01-19: 1 mL via INTRAMUSCULAR
  Filled 2018-01-19: qty 1

## 2018-01-19 NOTE — ED Provider Notes (Signed)
[facility] EMERGENCY DEPARTMENT Provider Note   CSN: [id number] Arrival date & time: 01/19/18  1758     History   Chief Complaint Chief Complaint  Patient presents with  . Animal Bite    HPI [name] is a 52 y.o. male who presents for evaluation of a dog bite.  He states that 3 days ago he was trying to help a stray dog.  He had the dog in his car and states that it was scared and when he let it out of his car it bit him on the left thumb.  The wound has been healing well and he has been taking care of it at home and has some mild redness around the nail but is not very painful.  He is requesting the rabies series since he does not have access to the dog and it was a stray.  He does not believe the dog has rabies but was advised by a friend to come have it done anyways.  His tetanus is not up-to-date.  HPI  Past Medical History:  Diagnosis Date  . Heart murmur    childhood heart murmur  . History of kidney stones   . Renal calculus, right     Patient Active Problem List   Diagnosis Date Noted  . Right knee pain 08/09/2016    Past Surgical History:  Procedure Laterality Date  . CYSTOSCOPY WITH URETEROSCOPY AND STENT PLACEMENT Left 05/18/2013   Procedure: LEFT URETEROSCOPY, Retrograde Pyelogram, Left Ureteral Dilation,STONE EXTRACTION, STENT PLACEMENT ;  Surgeon: Malka So, MD;  Location: [facility];  Service: Urology;  Laterality: Left;  . CYSTOSCOPY/URETEROSCOPY/HOLMIUM LASER/STENT PLACEMENT Right 03/16/2015   Procedure: CYSTOSCOPY/URETEROSCOPY/STONE EXTRACTION/HOLMIUM LASER/STENT PLACEMENT;  Surgeon: Irine Seal, MD;  Location: [facility];  Service: Urology;  Laterality: Right;  . EXTRACORPOREAL SHOCK WAVE LITHOTRIPSY Right 02/20/2017   Procedure: RIGHT EXTRACORPOREAL SHOCK WAVE LITHOTRIPSY (ESWL);  Surgeon: Cleon Gustin, MD;  Location: [facility];  Service: Urology;  Laterality: Right;  . HOLMIUM LASER APPLICATION Left  12/13/5174   Procedure: HOLMIUM LASER APPLICATION;  Surgeon: Malka So, MD;  Location: [facility];  Service: Urology;  Laterality: Left;  . KNEE ARTHROSCOPY Right 09/2016  . ORIF LEFT PROXIMAL TIBIAL FX  2006  . VASECTOMY Bilateral 05/18/2013   Procedure: BILATERAL VASECTOMY;  Surgeon: Malka So, MD;  Location: [facility];  Service: Urology;  Laterality: Bilateral;        Home Medications    Prior to Admission medications   Medication Sig Start Date End Date Taking? Authorizing Provider  HYDROcodone-acetaminophen (NORCO/VICODIN) 5-325 MG tablet Take 1 tablet by mouth 3 (three) times daily as needed for moderate pain. 10/03/16   Mcarthur Rossetti, MD  hyoscyamine (LEVSIN/SL) 0.125 MG SL tablet Place 1 tablet (0.125 mg total) under the tongue every 4 (four) hours as needed for cramping. 03/16/15   Irine Seal, MD  Multiple Vitamin (MULTIVITAMIN) tablet Take 1 tablet by mouth daily.    [provider]  Omega-3 Fatty Acids (FISH OIL) 1000 MG CAPS Take 2 capsules by mouth daily.    [provider]  ondansetron (ZOFRAN ODT) 4 MG disintegrating tablet Take 1 tablet (4 mg total) by mouth every 8 (eight) hours as needed for nausea or vomiting. 03/16/15   Irine Seal, MD  oxyCODONE (ROXICODONE) 5 MG immediate release tablet Take 1 tablet (5 mg total) by mouth every 6 (six) hours as needed for severe pain.  09/23/16   Hudnall, Sharyn Lull, MD  phenazopyridine (PYRIDIUM) 200 MG tablet Take 1 tablet (200 mg total) by mouth 3 (three) times daily as needed for pain. 03/16/15   Irine Seal, MD    Family History History reviewed. No pertinent family history.  Social History Social History   Tobacco Use  . Smoking status: Current Every Day Smoker    Packs/day: 1.00    Years: 26.00    Pack years: 26.00    Types: Cigarettes  . Smokeless tobacco: Never Used  Substance Use Topics  . Alcohol use: Yes    Alcohol/week: 10.5 oz    Types: 21 Standard drinks  or equivalent per week    Comment: 3 DRINKS DAILY  . Drug use: No     Allergies   Patient has no known allergies.   Review of Systems Review of Systems  Constitutional: Negative for fever.  Musculoskeletal: Positive for arthralgias.  Skin: Positive for wound.     Physical Exam Updated Vital Signs BP (!) 149/108 (BP Location: Left Arm)   Pulse (!) 101   Temp 98.6 F (37 C) (Oral)   Resp 20   Ht 5\' 10"  (1.778 m)   Wt (!) 138.3 kg (305 lb)   SpO2 99%   BMI 43.76 kg/m   Physical Exam  Constitutional: He is oriented to person, place, and time. He appears well-developed and well-nourished. No distress.  HENT:  Head: Normocephalic and atraumatic.  Eyes: Pupils are equal, round, and reactive to light. Conjunctivae are normal. Right eye exhibits no discharge. Left eye exhibits no discharge. No scleral icterus.  Neck: Normal range of motion.  Cardiovascular: Normal rate.  Pulmonary/Chest: Effort normal. No respiratory distress.  Abdominal: He exhibits no distension.  Musculoskeletal:  Small wound around the left thumb nailbed.  Surrounding erythema.  No drainage.  Full range of motion of the thumb.  Neurological: He is alert and oriented to person, place, and time.  Skin: Skin is warm and dry.  Psychiatric: He has a normal mood and affect. His behavior is normal.  Nursing note and vitals reviewed.    ED Treatments / Results  Labs (all labs ordered are listed, but only abnormal results are displayed) Labs Reviewed - No data to display  EKG None  Radiology No results found.  Procedures Procedures (including critical care time)  Medications Ordered in ED Medications  rabies immune globulin (HYPERAB/KEDRAB) injection 2,775 Units (has no administration in time range)  rabies vaccine (RABAVERT) injection 1 mL (has no administration in time range)  Tdap (BOOSTRIX) injection 0.5 mL (has no administration in time range)     Initial Impression / Assessment and Plan  / ED Course  I have reviewed the triage vital signs and the nursing notes.  Pertinent labs & imaging results that were available during my care of the patient were reviewed by me and considered in my medical decision making (see chart for details).  52 year old male presents for evaluation of a dog bite wound and for initiation of rabies vaccination.  He is hypertensive and mildly tachycardic but otherwise vital signs are normal. He is well appearing. Wound appears to be healing well and does not look infected. Tdap was updated. Rabies immunoglobulin was given as well as vaccination. Schedule for follow ups for additional vaccinations were given. Return precautions given.  Final Clinical Impressions(s) / ED Diagnoses   Final diagnoses:  Dog bite, initial encounter  Need for rabies vaccination    ED Discharge Orders  None       Iris Pert 01/19/18 2207    Orlie Dakin, MD 01/19/18 2325

## 2018-01-19 NOTE — ED Triage Notes (Addendum)
Patient reports dog bite to left thumb on Friday.  Puncture wounds noted to be healing to left thumb.  Dog unknown.

## 2018-01-19 NOTE — Discharge Instructions (Signed)
Please follow up with urgent care for your next vaccinations

## 2018-01-22 ENCOUNTER — Ambulatory Visit (HOSPITAL_COMMUNITY)
Admission: EM | Admit: 2018-01-22 | Discharge: 2018-01-22 | Disposition: A | Discharge status: Home / Self Care | Payer: 59 | Attending: Internal Medicine | Admitting: Internal Medicine

## 2018-01-22 DIAGNOSIS — Z23 Encounter for immunization: Secondary | ICD-10-CM | POA: Diagnosis not present

## 2018-01-22 DIAGNOSIS — Z203 Contact with and (suspected) exposure to rabies: Secondary | ICD-10-CM | POA: Diagnosis not present

## 2018-01-22 MED ORDER — RABIES VACCINE, PCEC IM SUSR
1.0000 mL | Freq: Once | INTRAMUSCULAR | Status: AC
  Administered 2018-01-22: 1 mL via INTRAMUSCULAR

## 2018-01-22 MED ORDER — RABIES VACCINE, PCEC IM SUSR
INTRAMUSCULAR | Status: AC
  Filled 2018-01-22: qty 1

## 2018-01-22 NOTE — ED Notes (Signed)
Pt here for 2nd round of rabies shot. WOund on L thumb well healed. Pt has no further questions

## 2018-01-26 ENCOUNTER — Ambulatory Visit (HOSPITAL_COMMUNITY)
Admission: EM | Admit: 2018-01-26 | Discharge: 2018-01-26 | Disposition: A | Discharge status: Home / Self Care | Payer: 59 | Attending: Internal Medicine | Admitting: Internal Medicine

## 2018-01-26 DIAGNOSIS — Z203 Contact with and (suspected) exposure to rabies: Secondary | ICD-10-CM

## 2018-01-26 DIAGNOSIS — Z23 Encounter for immunization: Secondary | ICD-10-CM

## 2018-01-26 MED ORDER — RABIES VACCINE, PCEC IM SUSR
1.0000 mL | Freq: Once | INTRAMUSCULAR | Status: AC
  Administered 2018-01-26: 1 mL via INTRAMUSCULAR

## 2018-01-26 MED ORDER — RABIES VACCINE, PCEC IM SUSR
INTRAMUSCULAR | Status: AC
  Filled 2018-01-26: qty 1

## 2018-01-26 MED ORDER — BACITRACIN ZINC 500 UNIT/GM EX OINT
TOPICAL_OINTMENT | CUTANEOUS | Status: AC
  Filled 2018-01-26: qty 18

## 2018-01-26 NOTE — ED Triage Notes (Signed)
Pt here for 2nd to last rabies injection.

## 2018-02-02 ENCOUNTER — Ambulatory Visit (HOSPITAL_COMMUNITY)
Admission: EM | Admit: 2018-02-02 | Discharge: 2018-02-02 | Disposition: A | Discharge status: Home / Self Care | Payer: 59 | Attending: Internal Medicine | Admitting: Internal Medicine

## 2018-02-02 DIAGNOSIS — Z203 Contact with and (suspected) exposure to rabies: Secondary | ICD-10-CM

## 2018-02-02 MED ORDER — RABIES VACCINE, PCEC IM SUSR
INTRAMUSCULAR | Status: AC
  Filled 2018-02-02: qty 1

## 2018-02-02 MED ORDER — RABIES VACCINE, PCEC IM SUSR
1.0000 mL | Freq: Once | INTRAMUSCULAR | Status: AC
  Administered 2018-02-02: 1 mL via INTRAMUSCULAR

## 2018-02-02 NOTE — ED Triage Notes (Signed)
Pt here for last rabies shot 

## 2018-06-24 IMAGING — MR MR KNEE*R* W/O CM
4 of 6 series · 21 of 40 positions shown · non-contrast
Comparison: None.

CLINICAL DATA: Acute right knee pain.

EXAM:
MRI OF THE RIGHT KNEE WITHOUT CONTRAST
TECHNIQUE: Multiplanar, multisequence MR imaging of the knee was performed. No
intravenous contrast was administered.

[Series 5: PD fat-sat · coronal · 3.5mm · 0.31mm/px · 7 of 27 slices shown (1 of 4)]
[im 1/27]
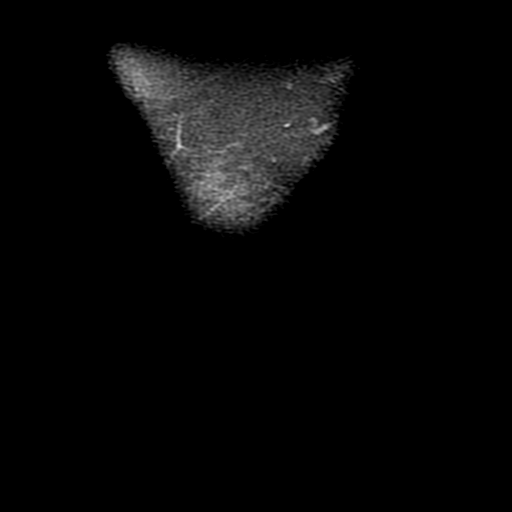
[im 5/27]
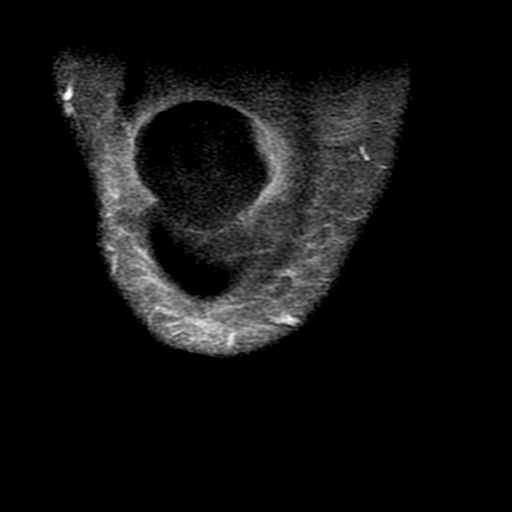
[im 9/27]
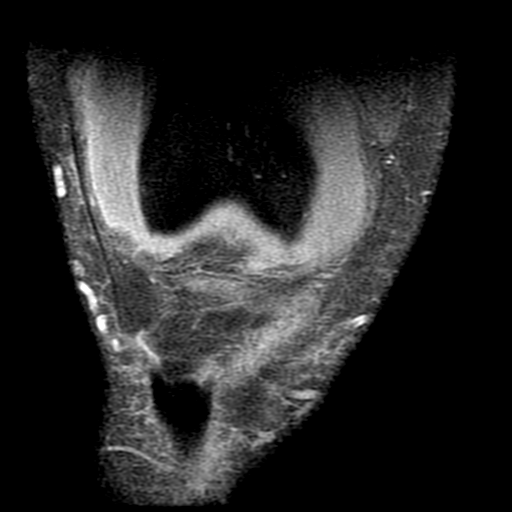
[im 14/27]
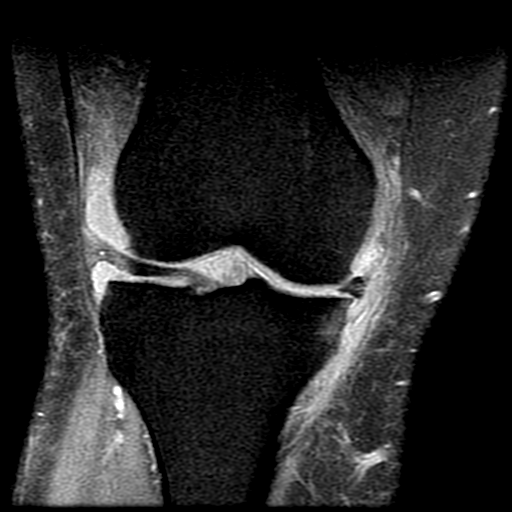
[im 18/27]
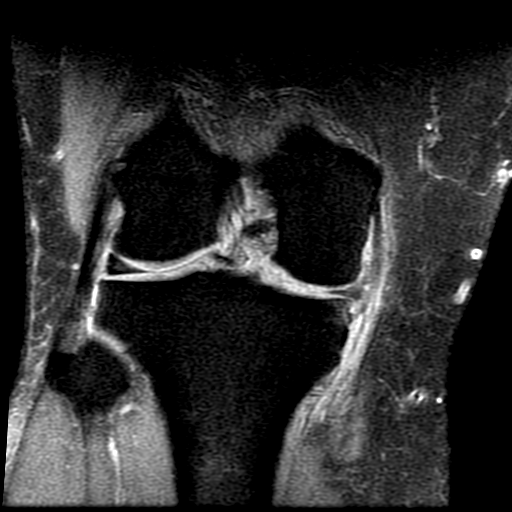
[im 22/27]
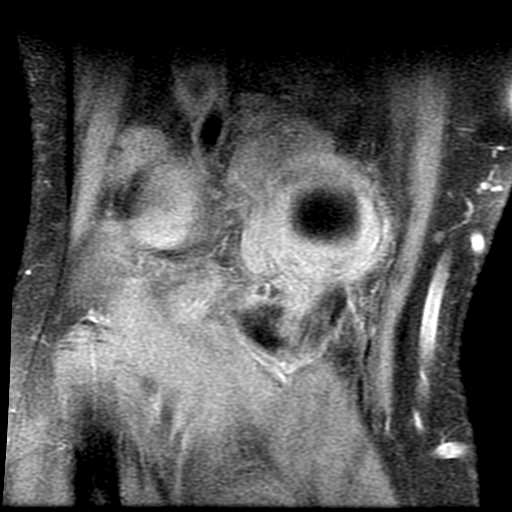
[im 27/27]
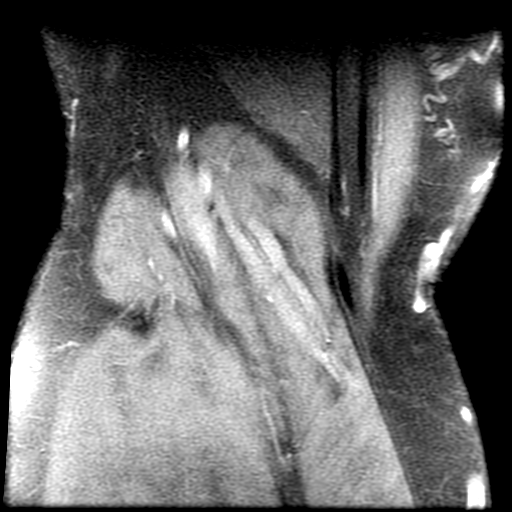

[Series 6: PD fat-sat · sagittal · 3.5mm · 0.31mm/px · 7 of 28 slices shown (2 of 4)]
[im 1/28]
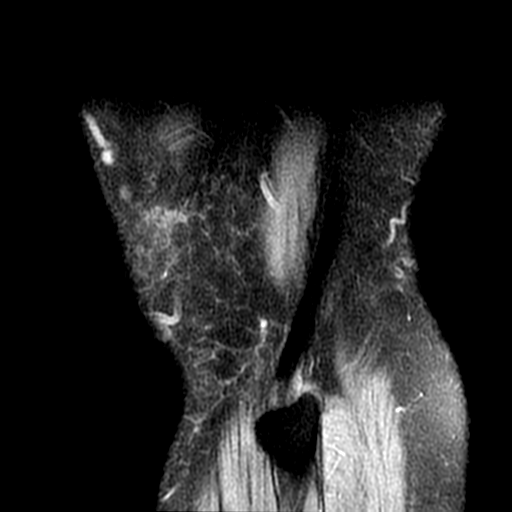
[im 5/28]
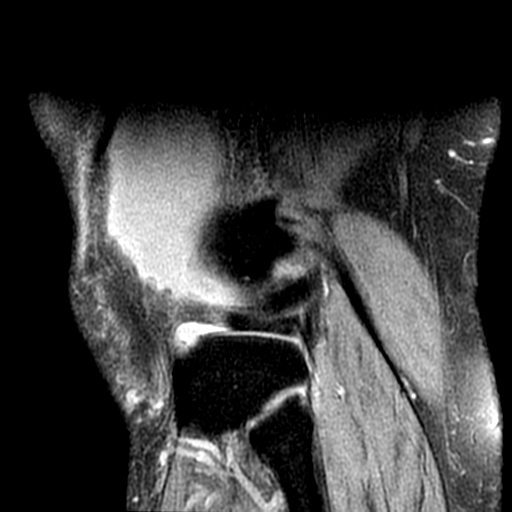
[im 10/28]
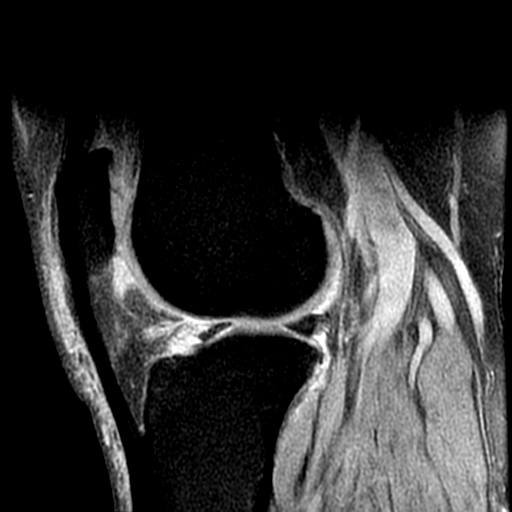
[im 14/28]
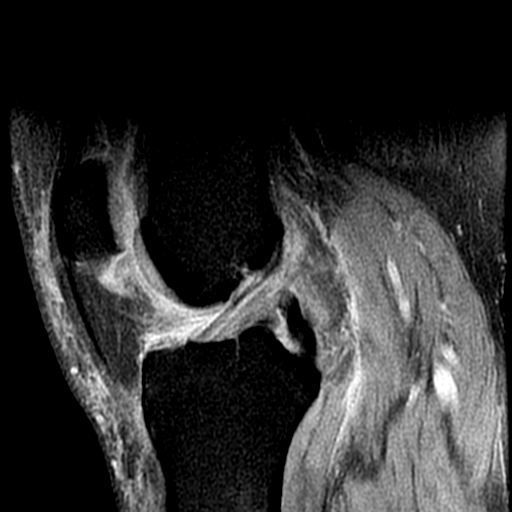
[im 19/28]
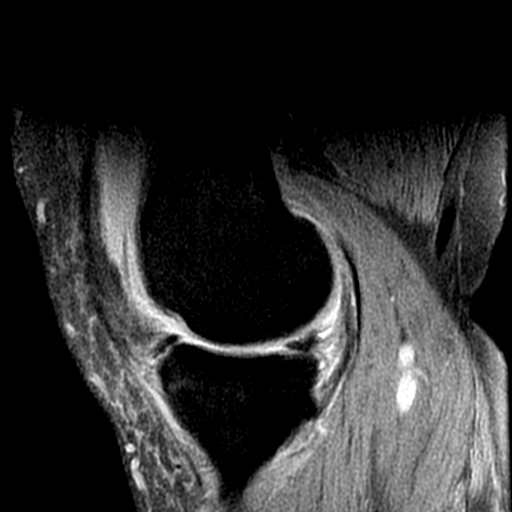
[im 23/28]
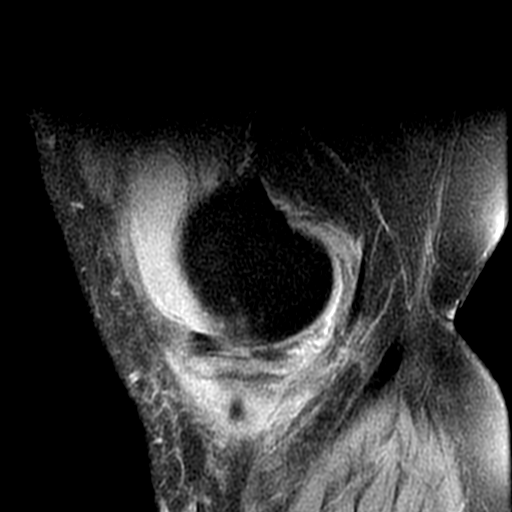
[im 28/28]
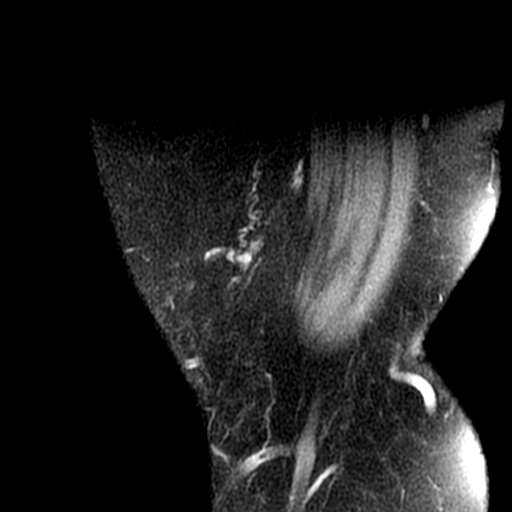

[Series 8: PD fat-sat · axial · 4.0mm · 0.35mm/px · z∈[-28,+72]mm · 4 of 26 slices shown (3 of 4)]
[im 1/26]
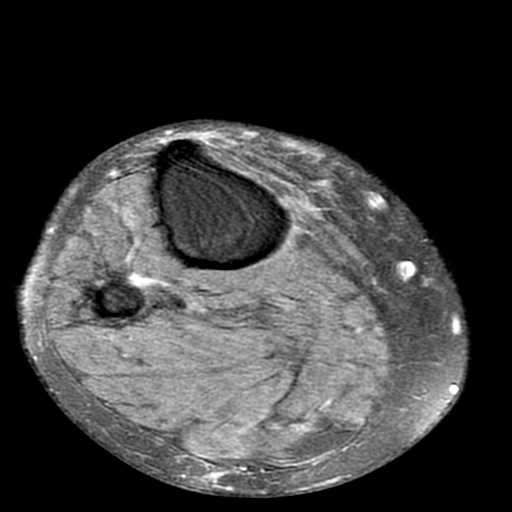
[im 5/26]
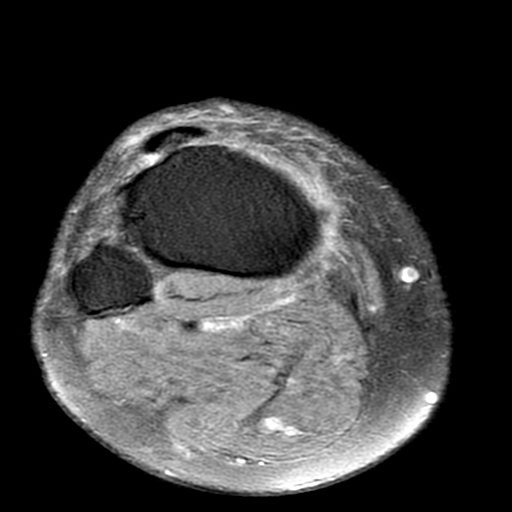
[im 13/26]
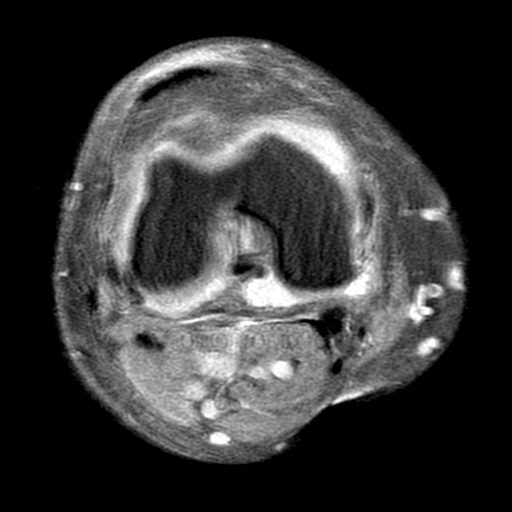
[im 21/26]
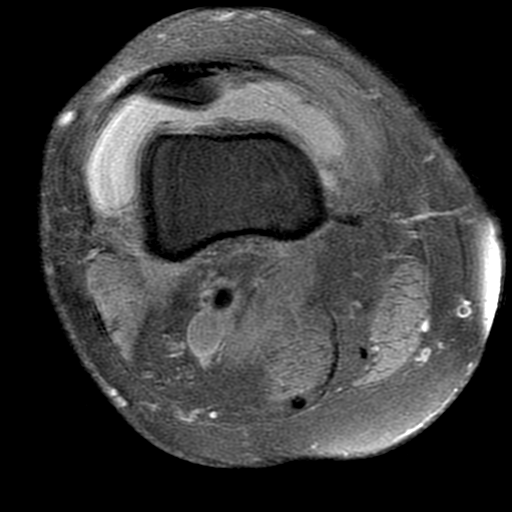

[Series 9: PD fat-sat · oblique · 2.0mm · 0.62mm/px · 3 of 17 slices shown (4 of 4)]
[im 1/17]
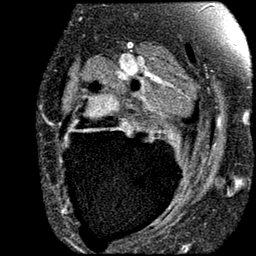
[im 11/17]
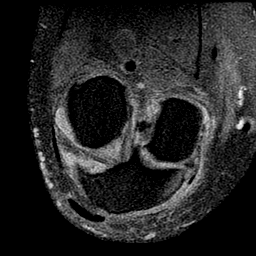
[im 17/17]
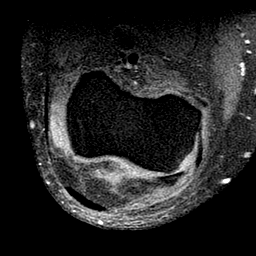

[21 of 40 positions shown; findings below may reference images not displayed]

FINDINGS: MENISCI

Medial meniscus: Partially aerated tear of the posterior horn of the
medial meniscus adjacent to the meniscal root. Mild complex tear of
the posterior horn- body junction of the medial meniscus.

Lateral meniscus:  Intact.

LIGAMENTS

Cruciates:  Intact ACL and PCL.

Collaterals: Mild soft tissue edema superficial and deep to the MCL
which may reflect mild MCL strain versus reactive changes secondary
to meniscal injury. Lateral collateral ligament complex is intact.

CARTILAGE

Patellofemoral:  Chondromalacia of the lateral patellar facet.

Medial: High-grade partial-thickness cartilage loss of the medial
femoral condyle and medial tibial plateau with subchondral reactive
marrow edema in the periphery of the medial tibial plateau.

Lateral: Mild cartilage irregularity of the posterolateral tibial
plateau.

Joint: Small joint effusion. Mild edema in Hoffa's fat. No plical
thickening.

Popliteal Fossa:  No Baker cyst.  Intact popliteus tendon.

Extensor Mechanism: Intact quadriceps tendon and patellar tendon.
Intact medial and lateral patellar retinaculum. Intact MPFL.

Bones: No other marrow signal abnormality. No fracture or
dislocation.

Other: No fluid collection or hematoma.
IMPRESSION: 1. Partially aerated tear of the posterior horn of the medial
meniscus adjacent to the meniscal root. Mild complex tear of the
posterior horn- body junction of the medial meniscus.
2. Mild soft tissue edema superficial and deep to the MCL which may
reflect mild MCL strain versus reactive changes secondary to
meniscal injury.
3. Tricompartmental cartilage abnormalities as described above, most
severe in the medial femorotibial compartment.

## 2018-08-13 DIAGNOSIS — Z0000 Encounter for general adult medical examination without abnormal findings: Secondary | ICD-10-CM | POA: Diagnosis not present

## 2018-08-13 DIAGNOSIS — Z13220 Encounter for screening for lipoid disorders: Secondary | ICD-10-CM | POA: Diagnosis not present

## 2018-08-26 DIAGNOSIS — E119 Type 2 diabetes mellitus without complications: Secondary | ICD-10-CM | POA: Diagnosis not present

## 2018-11-27 DIAGNOSIS — D128 Benign neoplasm of rectum: Secondary | ICD-10-CM | POA: Diagnosis not present

## 2018-11-27 DIAGNOSIS — D122 Benign neoplasm of ascending colon: Secondary | ICD-10-CM | POA: Diagnosis not present

## 2018-11-27 DIAGNOSIS — Z1211 Encounter for screening for malignant neoplasm of colon: Secondary | ICD-10-CM | POA: Diagnosis not present

## 2018-12-17 DIAGNOSIS — E1169 Type 2 diabetes mellitus with other specified complication: Secondary | ICD-10-CM | POA: Diagnosis not present

## 2018-12-17 DIAGNOSIS — E785 Hyperlipidemia, unspecified: Secondary | ICD-10-CM | POA: Diagnosis not present

## 2018-12-17 DIAGNOSIS — N6310 Unspecified lump in the right breast, unspecified quadrant: Secondary | ICD-10-CM | POA: Diagnosis not present

## 2018-12-22 ENCOUNTER — Other Ambulatory Visit: Payer: Self-pay | Admitting: Family Medicine

## 2018-12-22 DIAGNOSIS — N63 Unspecified lump in unspecified breast: Secondary | ICD-10-CM

## 2019-01-01 ENCOUNTER — Ambulatory Visit
Admission: RE | Admit: 2019-01-01 | Discharge: 2019-01-01 | Disposition: A | Discharge status: Home / Self Care | Payer: 59 | Source: Ambulatory Visit | Attending: Family Medicine | Admitting: Family Medicine

## 2019-01-01 ENCOUNTER — Other Ambulatory Visit: Payer: Self-pay

## 2019-01-01 ENCOUNTER — Other Ambulatory Visit: Payer: Self-pay | Admitting: Family Medicine

## 2019-01-01 DIAGNOSIS — N63 Unspecified lump in unspecified breast: Secondary | ICD-10-CM

## 2019-01-01 DIAGNOSIS — N6310 Unspecified lump in the right breast, unspecified quadrant: Secondary | ICD-10-CM | POA: Diagnosis not present

## 2019-01-20 DIAGNOSIS — N529 Male erectile dysfunction, unspecified: Secondary | ICD-10-CM | POA: Diagnosis not present

## 2019-01-20 DIAGNOSIS — F5101 Primary insomnia: Secondary | ICD-10-CM | POA: Diagnosis not present

## 2019-02-12 ENCOUNTER — Other Ambulatory Visit: Payer: Self-pay | Admitting: Urology

## 2019-02-12 DIAGNOSIS — R310 Gross hematuria: Secondary | ICD-10-CM | POA: Diagnosis not present

## 2019-02-12 DIAGNOSIS — R8271 Bacteriuria: Secondary | ICD-10-CM | POA: Diagnosis not present

## 2019-02-12 DIAGNOSIS — N200 Calculus of kidney: Secondary | ICD-10-CM | POA: Diagnosis not present

## 2019-02-16 ENCOUNTER — Other Ambulatory Visit: Payer: Self-pay

## 2019-02-16 ENCOUNTER — Encounter (HOSPITAL_COMMUNITY): Payer: Self-pay | Admitting: *Deleted

## 2019-02-16 NOTE — Progress Notes (Signed)
   02/16/19 1436  OBSTRUCTIVE SLEEP APNEA  Have you ever been diagnosed with sleep apnea through a sleep study? No  Do you snore loudly (loud enough to be heard through closed doors)?  1  Do you often feel tired, fatigued, or sleepy during the daytime (such as falling asleep during driving or talking to someone)? 0  Has anyone observed you stop breathing during your sleep? 1  Do you have, or are you being treated for high blood pressure? 0  BMI more than 35 kg/m2? 1  Age > 57 (1-yes) 1  Male Gender (Yes=1) 1  Obstructive Sleep Apnea Score 5  Score 5 or greater  Results sent to PCP

## 2019-02-19 DIAGNOSIS — F5101 Primary insomnia: Secondary | ICD-10-CM | POA: Diagnosis not present

## 2019-02-19 DIAGNOSIS — N529 Male erectile dysfunction, unspecified: Secondary | ICD-10-CM | POA: Diagnosis not present

## 2019-02-22 ENCOUNTER — Other Ambulatory Visit: Payer: Self-pay | Admitting: Urology

## 2019-02-23 ENCOUNTER — Other Ambulatory Visit (HOSPITAL_COMMUNITY)
Admission: RE | Admit: 2019-02-23 | Discharge: 2019-02-23 | Disposition: A | Discharge status: Home / Self Care | Payer: 59 | Source: Ambulatory Visit | Attending: Urology | Admitting: Urology

## 2019-02-23 ENCOUNTER — Other Ambulatory Visit: Payer: Self-pay

## 2019-02-23 DIAGNOSIS — Z1159 Encounter for screening for other viral diseases: Secondary | ICD-10-CM | POA: Insufficient documentation

## 2019-02-24 LAB — NOVEL CORONAVIRUS, NAA (HOSP ORDER, SEND-OUT TO REF LAB; TAT 18-24 HRS): SARS-CoV-2, NAA: NOT DETECTED

## 2019-02-24 NOTE — H&P (Signed)
I have kidney stones.  HPI: [name] is a 53 year-old male established patient who is here for renal calculi.    [name] returns today in f/u. He has a history of stones and last passed one in 11/19. He has no hematuria or pain at this time but did pass blood a couple of weeks ago and has >60 RBC's on UA today. He had a left renal stone on his prior KUB. He has no associated signs or symptoms.      CC: I am having trouble with my erections.  HPI:   He has been using sildenafil with successs. He thinks he would like to go up to the 177m.      ALLERGIES: No Allergies    MEDICATIONS: Metformin Hcl  Nexium  Multiple Vitamin  Wellbutrin Sr     GU PSH: Cysto Bladder Ureth Biopsy - 2016 Cysto Uretero Lithotripsy - 2016, 2014 Cystoscopy Insert Stent - 2016, 2014 ESWL - 02/20/2017 Vasectomy - 2014      PSH Notes: Cystoscopy With Ureteroscopy With Lithotripsy, Cystoscopy With Biopsy, Cystoscopy With Insertion Of Ureteral Stent Right, Cystoscopy With Ureteroscopy With Lithotripsy, Surgery Of Male Genitalia Vasectomy, Cystoscopy With Insertion Of Ureteral Stent Left, Knee Surgery Left   NON-GU PSH: None   GU PMH: Ureteral calculus, Right UPJ calculus causing right-sided hydronephrosis. Density ranges between 700-800 HU. - 02/13/2017 Renal calculus, Nephrolithiasis - 2016, Kidney stone on right side, - 2016, Kidney stone on left side, - 2016 ED due to arterial insufficiency, Erectile dysfunction due to arterial insufficiency - 2016 Encounter for Prostate Cancer screening, Prostate cancer screening - 2016 Gross hematuria, Gross hematuria - 2016, Gross Hematuria, - 2014 History of urolithiasis, Nephrolithiasis - 2014    NON-GU PMH: Encounter for general adult medical examination without abnormal findings, Encounter for preventive health examination - 2016 Diabetes Type 2    FAMILY HISTORY: Death In The Family Father - Runs In Family Diabetes - Grandfather Family Health Status  Number - Runs In Family nephrolithiasis - Father   SOCIAL HISTORY: Marital Status: Married Preferred Language: English; Race: White Current Smoking Status: Patient smokes.   Tobacco Use Assessment Completed: Used Tobacco in last 30 days? Drinks 2 drinks per day.  Does not drink caffeine.     Notes: Current every day smoker, Tobacco use, Alcohol Use, Caffeine Use, Occupation:, Marital History - Currently Married   REVIEW OF SYSTEMS:    GU Review Male:   Patient reports erection problems. Patient denies frequent urination, hard to postpone urination, burning/ pain with urination, get up at night to urinate, leakage of urine, stream starts and stops, trouble starting your stream, have to strain to urinate , and penile pain.  Gastrointestinal (Upper):   Patient reports indigestion/ heartburn. Patient denies nausea and vomiting.  Gastrointestinal (Lower):   Patient denies diarrhea and constipation.  Constitutional:   Patient denies fever, night sweats, weight loss, and fatigue.  Skin:   Patient denies skin rash/ lesion and itching.  Eyes:   Patient denies blurred vision and double vision.  Ears/ Nose/ Throat:   Patient denies sore throat and sinus problems.  Hematologic/Lymphatic:   Patient denies swollen glands and easy bruising.  Cardiovascular:   Patient denies leg swelling and chest pains.  Respiratory:   Patient denies cough and shortness of breath.  Endocrine:   Patient denies excessive thirst.  Musculoskeletal:   Patient denies back pain and joint pain.  Neurological:   Patient denies headaches and dizziness.  Psychologic:   Patient  denies depression and anxiety.   VITAL SIGNS:      02/12/2019 08:30 AM  Weight 273.5 lb / 124.06 kg  Height 71 in / 180.34 cm  BP 135/96 mmHg  Pulse 94 /min  Temperature 98.7 F / 37.0 C  BMI 38.1 kg/m   MULTI-SYSTEM PHYSICAL EXAMINATION:    Constitutional: Obese. No physical deformities. Normally developed. Good grooming.   Respiratory: Normal  breath sounds. No labored breathing, no use of accessory muscles.   Cardiovascular: Regular rate and rhythm. No murmur, no gallop.      PAST DATA REVIEWED:  Source Of History:  Patient  Urine Test Review:   Urinalysis  X-Ray Review: KUB: Reviewed Films. Discussed With Patient.     01/26/15  PSA  Total PSA 0.48     PROCEDURES:         KUB - 74018  A single view of the abdomen is obtained. There is an 8x11m [place] 510[place] stone. No bone, gas or soft tissue abnormalities noted.       Patient confirmed No Neulasta OnPro Device.           Urinalysis w/Scope Dipstick Dipstick Cont'd Micro  Color: Yellow Bilirubin: Neg mg/dL WBC/hpf: 6 - 10/hpf  Appearance: Cloudy Ketones: Neg mg/dL RBC/hpf: >60/hpf  Specific Gravity: 1.025 Blood: 3+ ery/uL Bacteria: Few (10-25/hpf)  pH: 6.0 Protein: 2+ mg/dL Cystals: NS (Not Seen)  Glucose: Neg mg/dL Urobilinogen: 0.2 mg/dL Casts: NS (Not Seen)    Nitrites: Neg Trichomonas: Not Present    Leukocyte Esterase: Trace leu/uL Mucous: Not Present      Epithelial Cells: 0 - 5/hpf      Yeast: NS (Not Seen)      Sperm: Not Present    ASSESSMENT:      ICD-10 Details  1 GU:   Renal calculus - N20.0 Worsening - He has an 8x1467mUPJ stone. I discussed options including MET, ESWL and URS and he would like to proceed with ESWL. I reviewed the risks of ESWL including bleeding, infection, injury to the kidney or adjacent structures, failure to fragment the stone, need for ancillary procedures, thrombotic events, cardiac arrhythmias and sedation complications.   2   Gross hematuria - R31.0 He had hematuria a couple of weeks ago. UA has blood, WBC and bacteria. UCx today.   3   ED due to arterial insufficiency - N52.01 Stable - I have given him sildenafil 100m64m  PLAN:            Medications New Meds: Sildenafil Citrate 100 mg tablet 1 tablet PO Daily PRN   #30  0 Refill(s)    Stop Meds: Oxycodone-Acetaminophen 5 mg-325 mg tablet 1 tablet  PO Q 4 H PRN  Start: 02/13/2017  Discontinue: 02/12/2019  - Reason: The medication cycle was completed.            Orders Labs Urine Culture  X-Rays: KUB   After a thorough review of the management options for the patient's condition the patient  elected to proceed with surgical therapy as noted above. We have discussed the potential benefits and risks of the procedure, side effects of the proposed treatment, the likelihood of the patient achieving the goals of the procedure, and any potential problems that might occur during the procedure or recuperation. Informed consent has been obtained.

## 2019-02-25 ENCOUNTER — Ambulatory Visit (HOSPITAL_COMMUNITY)
Admission: RE | Admit: 2019-02-25 | Discharge: 2019-02-25 | Disposition: A | Discharge status: Home / Self Care | Payer: 59 | Attending: Urology | Admitting: Urology

## 2019-02-25 ENCOUNTER — Encounter (HOSPITAL_COMMUNITY): Payer: Self-pay | Admitting: General Practice

## 2019-02-25 ENCOUNTER — Ambulatory Visit (HOSPITAL_COMMUNITY): Payer: 59

## 2019-02-25 ENCOUNTER — Encounter (HOSPITAL_COMMUNITY)
Admission: RE | Disposition: A | Discharge status: Home / Self Care | Payer: Self-pay | Source: Home / Self Care | Attending: Urology

## 2019-02-25 DIAGNOSIS — I771 Stricture of artery: Secondary | ICD-10-CM | POA: Diagnosis not present

## 2019-02-25 DIAGNOSIS — F17210 Nicotine dependence, cigarettes, uncomplicated: Secondary | ICD-10-CM | POA: Diagnosis not present

## 2019-02-25 DIAGNOSIS — N201 Calculus of ureter: Secondary | ICD-10-CM

## 2019-02-25 DIAGNOSIS — N200 Calculus of kidney: Secondary | ICD-10-CM | POA: Diagnosis not present

## 2019-02-25 HISTORY — DX: Type 2 diabetes mellitus without complications: E11.9

## 2019-02-25 HISTORY — PX: EXTRACORPOREAL SHOCK WAVE LITHOTRIPSY: SHX1557

## 2019-02-25 HISTORY — DX: Depression, unspecified: F32.A

## 2019-02-25 HISTORY — DX: Pure hypercholesterolemia, unspecified: E78.00

## 2019-02-25 HISTORY — DX: Anxiety disorder, unspecified: F41.9

## 2019-02-25 LAB — GLUCOSE, CAPILLARY: Glucose-Capillary: 126 mg/dL — ABNORMAL HIGH (ref 70–99)

## 2019-02-25 SURGERY — LITHOTRIPSY, ESWL
Anesthesia: LOCAL | Laterality: Left

## 2019-02-25 MED ORDER — CIPROFLOXACIN HCL 500 MG PO TABS
500.0000 mg | ORAL_TABLET | ORAL | Status: AC
  Administered 2019-02-25: 500 mg via ORAL
  Filled 2019-02-25: qty 1

## 2019-02-25 MED ORDER — DIPHENHYDRAMINE HCL 25 MG PO CAPS
25.0000 mg | ORAL_CAPSULE | ORAL | Status: AC
  Administered 2019-02-25: 25 mg via ORAL
  Filled 2019-02-25: qty 1

## 2019-02-25 MED ORDER — SODIUM CHLORIDE 0.9 % IV SOLN
INTRAVENOUS | Status: DC
  Administered 2019-02-25: 06:00:00 via INTRAVENOUS

## 2019-02-25 MED ORDER — HYDROCODONE-ACETAMINOPHEN 5-325 MG PO TABS: 1.0000 | ORAL_TABLET | Freq: Four times a day (QID) | ORAL | 0 refills | Status: DC | PRN

## 2019-02-25 MED ORDER — DIAZEPAM 5 MG PO TABS
10.0000 mg | ORAL_TABLET | ORAL | Status: AC
  Administered 2019-02-25: 06:00:00 10 mg via ORAL
  Filled 2019-02-25: qty 2

## 2019-02-25 NOTE — Discharge Instructions (Signed)
I have reviewed discharge instructions in detail with the patient. They will follow-up with me or their physician as scheduled. My nurse will also be calling the patients as per protocol.   

## 2019-02-25 NOTE — Interval H&P Note (Signed)
History and Physical Interval Note:  02/25/2019 6:59 AM  [name]  has presented today for surgery, with the diagnosis of LEFT URETERAL PELVIC JUNCTION STONE.  The various methods of treatment have been discussed with the patient and family. After consideration of risks, benefits and other options for treatment, the patient has consented to  Procedure(s): EXTRACORPOREAL SHOCK WAVE LITHOTRIPSY (ESWL) (Left) as a surgical intervention.  The patient's history has been reviewed, patient examined, no change in status, stable for surgery.  I have reviewed the patient's chart and labs.  Questions were answered to the patient's satisfaction.     Nela Bascom A Khadeja Abt

## 2019-02-26 ENCOUNTER — Encounter (HOSPITAL_COMMUNITY): Payer: Self-pay | Admitting: Urology

## 2020-01-18 ENCOUNTER — Ambulatory Visit: Payer: 59 | Attending: Internal Medicine

## 2020-01-18 DIAGNOSIS — Z23 Encounter for immunization: Secondary | ICD-10-CM

## 2020-01-18 NOTE — Progress Notes (Signed)
   Covid-19 Vaccination Clinic  Name:  [name]    MRN: [id number] DOB: 03-Oct-1966  01/18/2020  [name] was observed post Covid-19 immunization for 15 minutes without incident. He was provided with Vaccine Information Sheet and instruction to access the V-Safe system.   [name] was instructed to call 911 with any severe reactions post vaccine: Marland Kitchen Difficulty breathing  . Swelling of face and throat  . A fast heartbeat  . A bad rash all over body  . Dizziness and weakness   Immunizations Administered    Name Date Dose VIS Date Route   Moderna COVID-19 Vaccine 01/18/2020 11:48 AM 0.5 mL 09/14/2019 Intramuscular   Manufacturer: Moderna   LotMV:[id number]   [id number]

## 2021-11-27 DIAGNOSIS — I10 Essential (primary) hypertension: Secondary | ICD-10-CM | POA: Diagnosis not present

## 2021-11-27 DIAGNOSIS — E1169 Type 2 diabetes mellitus with other specified complication: Secondary | ICD-10-CM | POA: Diagnosis not present

## 2021-11-27 DIAGNOSIS — G629 Polyneuropathy, unspecified: Secondary | ICD-10-CM | POA: Diagnosis not present

## 2021-11-27 DIAGNOSIS — F411 Generalized anxiety disorder: Secondary | ICD-10-CM | POA: Diagnosis not present

## 2022-01-17 DIAGNOSIS — G4733 Obstructive sleep apnea (adult) (pediatric): Secondary | ICD-10-CM | POA: Diagnosis not present

## 2022-02-04 DIAGNOSIS — R1084 Generalized abdominal pain: Secondary | ICD-10-CM | POA: Diagnosis not present

## 2022-02-07 DIAGNOSIS — G4733 Obstructive sleep apnea (adult) (pediatric): Secondary | ICD-10-CM | POA: Diagnosis not present

## 2022-06-20 DIAGNOSIS — F411 Generalized anxiety disorder: Secondary | ICD-10-CM | POA: Diagnosis not present

## 2022-06-20 DIAGNOSIS — Z125 Encounter for screening for malignant neoplasm of prostate: Secondary | ICD-10-CM | POA: Diagnosis not present

## 2022-06-20 DIAGNOSIS — Z23 Encounter for immunization: Secondary | ICD-10-CM | POA: Diagnosis not present

## 2022-06-20 DIAGNOSIS — I10 Essential (primary) hypertension: Secondary | ICD-10-CM | POA: Diagnosis not present

## 2022-06-20 DIAGNOSIS — N529 Male erectile dysfunction, unspecified: Secondary | ICD-10-CM | POA: Diagnosis not present

## 2022-06-20 DIAGNOSIS — E785 Hyperlipidemia, unspecified: Secondary | ICD-10-CM | POA: Diagnosis not present

## 2022-06-20 DIAGNOSIS — Z0000 Encounter for general adult medical examination without abnormal findings: Secondary | ICD-10-CM | POA: Diagnosis not present

## 2022-06-20 DIAGNOSIS — E1169 Type 2 diabetes mellitus with other specified complication: Secondary | ICD-10-CM | POA: Diagnosis not present

## 2022-08-14 ENCOUNTER — Ambulatory Visit: Payer: Self-pay

## 2022-08-14 ENCOUNTER — Ambulatory Visit (INDEPENDENT_AMBULATORY_CARE_PROVIDER_SITE_OTHER): Payer: BC Managed Care – PPO

## 2022-08-14 ENCOUNTER — Ambulatory Visit (INDEPENDENT_AMBULATORY_CARE_PROVIDER_SITE_OTHER): Payer: BC Managed Care – PPO | Admitting: Sports Medicine

## 2022-08-14 DIAGNOSIS — M25551 Pain in right hip: Secondary | ICD-10-CM

## 2022-08-14 DIAGNOSIS — M1711 Unilateral primary osteoarthritis, right knee: Secondary | ICD-10-CM

## 2022-08-14 DIAGNOSIS — M25552 Pain in left hip: Secondary | ICD-10-CM

## 2022-08-14 DIAGNOSIS — M1712 Unilateral primary osteoarthritis, left knee: Secondary | ICD-10-CM

## 2022-08-14 MED ORDER — LIDOCAINE HCL 1 % IJ SOLN
4.0000 mL | INTRAMUSCULAR | Status: AC | PRN
  Administered 2022-08-14: 4 mL

## 2022-08-14 MED ORDER — METHYLPREDNISOLONE ACETATE 40 MG/ML IJ SUSP
40.0000 mg | INTRAMUSCULAR | Status: AC | PRN
  Administered 2022-08-14: 40 mg via INTRA_ARTICULAR

## 2022-08-14 NOTE — Addendum Note (Signed)
Addended by: Rudy Jew C on: 08/14/2022 10:08 AM   Modules accepted: Orders

## 2022-08-14 NOTE — Progress Notes (Signed)
   Procedure Note  Patient: [name]             Date of Birth: 05/19/1966           MRN: [id number]             Visit Date: 08/14/2022  Procedures: Visit Diagnoses:  1. Pain of right hip   2. Pain of left hip   3. Unilateral primary osteoarthritis, left knee   4. Unilateral primary osteoarthritis, right knee    Large Joint Inj: R hip joint on 08/14/2022 10:54 AM Indications: pain Details: 22 G 3.5 in needle, ultrasound-guided anterior approach Medications: 4 mL lidocaine 1 %; 40 mg methylPREDNISolone acetate 40 MG/ML Outcome: tolerated well, no immediate complications  Procedure: US-guided intra-articular hip injection, right After discussion on risks/benefits/indications and informed verbal consent was obtained, a timeout was performed. Patient was lying supine on exam table. The hip was cleaned with betadine and alcohol swabs. Then utilizing ultrasound guidance, the patient's femoral head and neck junction was identified and subsequently injected with 4:1 lidocaine:depomedrol via an in-plane approach with ultrasound visualization of the injectate administered into the hip joint. Patient tolerated procedure well without immediate complications.  Procedure, treatment alternatives, risks and benefits explained, specific risks discussed. Consent was given by the patient. Immediately prior to procedure a time out was called to verify the correct patient, procedure, equipment, support staff and site/side marked as required. Patient was prepped and draped in the usual sterile fashion.     - I evaluated the patient about 10 minutes post-injection and he had significant improvement in pain and range of motion - follow-up with Dr. Ninfa Linden as indicated in 4 weeks; I am happy to see them as needed  Elba Barman, DO [place]  This note was dictated using Dragon naturally speaking software and may contain errors in syntax,  spelling, or content which have not been identified prior to signing this note.

## 2022-08-14 NOTE — Progress Notes (Signed)
Office Visit Note   Patient: [name]           Date of Birth: 11-14-1965           MRN: [id number] Visit Date: 08/14/2022              Requested by: No referring provider defined for this encounter. PCP: Kristen Loader, FNP   Assessment & Plan: Visit Diagnoses:  1. Pain of left hip   2. Pain of right hip   3. Unilateral primary osteoarthritis, left knee   4. Unilateral primary osteoarthritis, right knee     Plan: I went over the patient's x-rays with him in detail.  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  Based on my clinical assessment and x-ray view from x-rays today and the abdominal films with 3 and half years ago, this appears to be osteoarthritis from my standpoint.  I showed him a hip replacement model and explained in detail with hip replacement surgery involves.  I would like to send him to Dr. Rolena Infante for an intra-articular injection of a steroid in that right hip joint for diagnostic and therapeutic purposes.  The patient advocates that as well and agrees with this treatment plan.  We will see if Dr. Rolena Infante can see him for an injection in the right hip joint under ultrasound and I will see him back in 4 weeks to get an idea of if this is worked.  All question concerns were answered and addressed.  Follow-Up Instructions: Return in about 4 weeks (around 09/11/2022).   Orders:  Orders Placed This Encounter  Procedures   XR HIPS BILAT W OR W/O PELVIS 2V   No orders of the defined types were placed in this encounter.     Procedures: No procedures performed   Clinical Data: No additional findings.   Subjective: Chief Complaint  Patient presents with   Left Hip - Pain   Right Hip - Pain  The patient is a remote patient of mine who comes in with bilateral hip pain with the right worse than left.  He denies any groin pain  but he does report stiffness of both hips with the right much worse than left.  This is starting to detrimentally affect his mobility, his quality of life and his actives daily living.  He has a harder time putting shoes and socks on the right side and crossing his right leg over the left.  He is a prediabetic.  He did lose a significant mount of weight over the last year or 2 but has gained some of this back.  He is not on blood thinning medications.  He denies any specific injuries.  HPI  Review of Systems There is currently listed no fever, chills, nausea, vomiting  Objective: Vital Signs: There were no vitals taken for this visit.  Physical Exam He is alert and orient x3 and in no acute distress Ortho Exam Examination of both hips actually shows stiffness with internal and external rotation more so on the right than the left.  When I have him lay in a supine position I feel that there is a leg length discrepancy with the right side slightly shorter than the left.  He does state that he has been starting to have more of a limp to his gait.  He  does have pain over the trochanteric area of his hip and there is some pain in the groin on my exam. Specialty Comments:  No specialty comments available.  Imaging: XR HIPS BILAT W OR W/O PELVIS 2V  Result Date: 08/14/2022 An AP pelvis and lateral of both hips shows significant superolateral joint space narrowing of both hips.  When compared to an abdominal film from 3 years ago, the joint space is significantly narrowed.  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    PMFS History: Patient Active Problem List   Diagnosis Date Noted   Right knee pain 08/09/2016   Past Medical History:  Diagnosis Date   Anxiety    Depression    Diabetes mellitus without complication (HCC)    type 2   Heart murmur    childhood heart murmur   High cholesterol    History of kidney stones      No family history on file.  Past Surgical History:  Procedure Laterality Date   CYSTOSCOPY WITH URETEROSCOPY AND STENT PLACEMENT Left 05/18/2013   Procedure: LEFT URETEROSCOPY, Retrograde Pyelogram, Left Ureteral Dilation,STONE EXTRACTION, STENT PLACEMENT ;  Surgeon: Malka So, MD;  Location: [facility];  Service: Urology;  Laterality: Left;   CYSTOSCOPY/URETEROSCOPY/HOLMIUM LASER/STENT PLACEMENT Right 03/16/2015   Procedure: CYSTOSCOPY/URETEROSCOPY/STONE EXTRACTION/HOLMIUM LASER/STENT PLACEMENT;  Surgeon: Irine Seal, MD;  Location: [facility];  Service: Urology;  Laterality: Right;   EXTRACORPOREAL SHOCK WAVE LITHOTRIPSY Right 02/20/2017   Procedure: RIGHT EXTRACORPOREAL SHOCK WAVE LITHOTRIPSY (ESWL);  Surgeon: Cleon Gustin, MD;  Location: [facility];  Service: Urology;  Laterality: Right;   EXTRACORPOREAL SHOCK WAVE LITHOTRIPSY Left 02/25/2019   Procedure: EXTRACORPOREAL SHOCK WAVE LITHOTRIPSY (ESWL);  Surgeon: Bjorn Loser, MD;  Location: [facility];  Service: Urology;  Laterality: Left;   HOLMIUM LASER APPLICATION Left 1/0/2725   Procedure: HOLMIUM LASER APPLICATION;  Surgeon: Malka So, MD;  Location[facility];  Service: Urology;  Laterality: Left;   KNEE ARTHROSCOPY Right 09/2016   KNEE ARTHROSCOPY Left    reconstruction    ORIF LEFT PROXIMAL TIBIAL FX  2006   VASECTOMY Bilateral 05/18/2013   Procedure: BILATERAL VASECTOMY;  Surgeon: Malka So, MD;  Location: [facility];  Service: Urology;  Laterality: Bilateral;   Social History   Occupational History   Not on file  Tobacco Use   Smoking status: Every Day    Packs/day: 1.00    Years: 26.00    Total pack years: 26.00    Types: Cigarettes   Smokeless tobacco: Never   Tobacco comments:    trying to quit 3 cig per day  Vaping Use   Vaping Use: Never used  Substance and Sexual Activity   Alcohol use: Yes    Alcohol/week: 21.0 standard drinks of alcohol     Types: 21 Standard drinks or equivalent per week    Comment: occ   Drug use: No   Sexual activity: Not on file

## 2022-09-11 ENCOUNTER — Ambulatory Visit (INDEPENDENT_AMBULATORY_CARE_PROVIDER_SITE_OTHER): Payer: BC Managed Care – PPO | Admitting: Orthopaedic Surgery

## 2022-09-11 ENCOUNTER — Ambulatory Visit: Payer: BC Managed Care – PPO | Admitting: Sports Medicine

## 2022-09-11 ENCOUNTER — Encounter: Payer: Self-pay | Admitting: Orthopaedic Surgery

## 2022-09-11 DIAGNOSIS — M1711 Unilateral primary osteoarthritis, right knee: Secondary | ICD-10-CM

## 2022-09-11 MED ORDER — MELOXICAM 7.5 MG PO TABS: 7.5000 mg | ORAL_TABLET | Freq: Two times a day (BID) | ORAL | 2 refills | Status: DC

## 2022-09-11 NOTE — Progress Notes (Signed)
HPI: [name] returns today status post right hip intra-articular injection 08/14/2022.  He states that he got 50 to 60% relief for about 2 weeks.  But then he states once he returned to his normal activities such as playing golf and pickleball that his pain is increased.  He notes that he is back at baseline in regards to his hip pain on the right side.  He states he had trouble walking yesterday due to the pain after playing pickle ball the day prior.  He has tried Advil Tylenol Aleve without much relief.  Review of systems: See HPI otherwise negative  Physical exam: General well-developed well-nourished male no acute distress.  Ambulates without any assistive devices able to get on and off the exam table on his own. Bilateral hips limited range of motion with internal/external rotation.  Maximal pain right hip with external rotation.  Tenderness over the trochanteric region.  Impression: Right hip osteoarthritis  Plan: Patient at this point time would like to stay with conservative treatment as he has a trip to [place] coming up.  He is asked about going back on an anti-inflammatory prescription.  He has tried Mobic in the past and is tolerated this.  Therefore we will have him go on Mobic 7.5 mg twice daily.  He can use it twice daily on bad days on most days and like for him to use the lower dose of 7.5 mg once daily.  He is to avoid all other NSAIDs and this is discussed.  He can also take Tylenol with the Mobic.  He will follow-up with [place] as needed pain persist becomes worse.  Questions were encouraged and answered.  He understands that total hip replacement is elective which is indicated once all conservative measures been exhausted and he is having pain that affects his quality of life.  Questions were encouraged and answered by Dr.Blackman and myself.

## 2023-01-03 DIAGNOSIS — E1169 Type 2 diabetes mellitus with other specified complication: Secondary | ICD-10-CM | POA: Diagnosis not present

## 2023-01-03 DIAGNOSIS — I10 Essential (primary) hypertension: Secondary | ICD-10-CM | POA: Diagnosis not present

## 2023-01-03 DIAGNOSIS — F411 Generalized anxiety disorder: Secondary | ICD-10-CM | POA: Diagnosis not present

## 2023-01-15 ENCOUNTER — Other Ambulatory Visit: Payer: Self-pay | Admitting: Physician Assistant

## 2023-01-23 ENCOUNTER — Encounter: Payer: Self-pay | Admitting: Physician Assistant

## 2023-01-23 ENCOUNTER — Other Ambulatory Visit (INDEPENDENT_AMBULATORY_CARE_PROVIDER_SITE_OTHER): Payer: BC Managed Care – PPO

## 2023-01-23 ENCOUNTER — Ambulatory Visit (INDEPENDENT_AMBULATORY_CARE_PROVIDER_SITE_OTHER): Payer: BC Managed Care – PPO | Admitting: Physician Assistant

## 2023-01-23 DIAGNOSIS — M25562 Pain in left knee: Secondary | ICD-10-CM

## 2023-01-23 DIAGNOSIS — G4733 Obstructive sleep apnea (adult) (pediatric): Secondary | ICD-10-CM | POA: Diagnosis not present

## 2023-01-23 NOTE — Progress Notes (Signed)
Office Visit Note   Patient: [name]           Date of Birth: September 26, 1966           MRN: [id number] Visit Date: 01/23/2023              Requested by: Soundra Pilon, FNP [address] [place],  [place] [address] PCP: Soundra Pilon, FNP   Assessment & Plan: Visit Diagnoses:  1. Acute pain of left knee     Plan: Will see him back in 2 weeks to see what type of response he had to the injection.  He was wrapped in Ace bandage today he will need to remove this before retiring to bed this evening.  He is to be mindful of any mechanical symptoms of the knee.  Questions were encouraged and answered at length.  Follow-Up Instructions: Return in about 2 weeks (around 02/06/2023).   Orders:  Orders Placed This Encounter  Procedures   XR Knee 1-2 Views Left   No orders of the defined types were placed in this encounter.     Procedures: Large Joint Inj on 01/23/2023 4:47 PM Indications: pain Details: 22 G 1.5 in needle, superolateral approach  Arthrogram: No  Medications: 3 mL lidocaine 1 %; 40 mg methylPREDNISolone acetate 40 MG/ML Outcome: tolerated well, no immediate complications Procedure, treatment alternatives, risks and benefits explained, specific risks discussed. Consent was given by the patient. Immediately prior to procedure a time out was called to verify the correct patient, procedure, equipment, support staff and site/side marked as required. Patient was prepped and draped in the usual sterile fashion.       Clinical Data: No additional findings.   Subjective: Chief Complaint  Patient presents with   Left Knee - Pain    HPI [name] 57 year old male is well-known to Dr. Raye Sorrow service with known osteoarthritis bilateral hips.  Comes in today with left knee pain.  Left knee pains been present past month after playing pickle ball.  States knee pain just will not go away.  He notes knee feels like it may give way at times.  No other mechanical symptoms.   Pain is 4 out of 10 at worst.  Does wear knee brace whenever he plays pickle ball.  He is taking meloxicam.  He has had no treatment for this.  Negative for fevers chills.  Prediabetic.  Reports that his glucose levels daily are never over 120. Review of Systems  See HPI otherwise negative or noncontributory. Objective: Vital Signs: There were no vitals taken for this visit.  Physical Exam Constitutional:      Appearance: He is not ill-appearing or diaphoretic.  Pulmonary:     Effort: Pulmonary effort is normal.  Neurological:     Mental Status: He is alert and oriented to person, place, and time.  Psychiatric:        Mood and Affect: Mood normal.     Ortho Exam Bilateral knees good range of motion both knees.  Left greater than right patellofemoral crepitus.  No instability valgus varus stressing of either knee.  No abnormal warmth erythema of either knee.  Positive effusion left knee no effusion right knee.  McMurray's left knee is negative.  Well-healed surgical incisions over the left knee. Specialty Comments:  No specialty comments available.  Imaging: XR Knee 1-2 Views Left  Result Date: 01/23/2023 Left knee 2 views: Shows tricompartmental arthritis with moderate narrowing medial joint line.  Mild to moderate  patellofemoral changes and osteophytes off the lateral compartment.  Status post open reduction internal fixation tibial plateau fracture with retained hardware.  There is no hardware failure.  Knee is well located.  Heterotopic bone off the medial aspect of the proximal tibia.    PMFS History: Patient Active Problem List   Diagnosis Date Noted   Right knee pain 08/09/2016   Past Medical History:  Diagnosis Date   Anxiety    Depression    Diabetes mellitus without complication    type 2   Heart murmur    childhood heart murmur   High cholesterol    History of kidney stones     History reviewed. No pertinent family history.  Past Surgical History:  Procedure  Laterality Date   CYSTOSCOPY WITH URETEROSCOPY AND STENT PLACEMENT Left 05/18/2013   Procedure: LEFT URETEROSCOPY, Retrograde Pyelogram, Left Ureteral Dilation,STONE EXTRACTION, STENT PLACEMENT ;  Surgeon: Anner Crete, MD;  Location: [facility];  Service: Urology;  Laterality: Left;   CYSTOSCOPY/URETEROSCOPY/HOLMIUM LASER/STENT PLACEMENT Right 03/16/2015   Procedure: CYSTOSCOPY/URETEROSCOPY/STONE EXTRACTION/HOLMIUM LASER/STENT PLACEMENT;  Surgeon: Bjorn Pippin, MD;  Location[facility];  Service: Urology;  Laterality: Right;   EXTRACORPOREAL SHOCK WAVE LITHOTRIPSY Right 02/20/2017   Procedure: RIGHT EXTRACORPOREAL SHOCK WAVE LITHOTRIPSY (ESWL);  Surgeon: Malen Gauze, MD;  Location: [facility];  Service: Urology;  Laterality: Right;   EXTRACORPOREAL SHOCK WAVE LITHOTRIPSY Left 02/25/2019   Procedure: EXTRACORPOREAL SHOCK WAVE LITHOTRIPSY (ESWL);  Surgeon: Alfredo Martinez, MD;  Location: [facility];  Service: Urology;  Laterality: Left;   HOLMIUM LASER APPLICATION Left 05/18/2013   Procedure: HOLMIUM LASER APPLICATION;  Surgeon: Anner Crete, MD;  Location: [facility];  Service: Urology;  Laterality: Left;   KNEE ARTHROSCOPY Right 09/2016   KNEE ARTHROSCOPY Left    reconstruction    ORIF LEFT PROXIMAL TIBIAL FX  2006   VASECTOMY Bilateral 05/18/2013   Procedure: BILATERAL VASECTOMY;  Surgeon: Anner Crete, MD;  [facility];  Service: Urology;  Laterality: Bilateral;   Social History   Occupational History   Not on file  Tobacco Use   Smoking status: Every Day    Packs/day: 1.00    Years: 26.00    Additional pack years: 0.00    Total pack years: 26.00    Types: Cigarettes   Smokeless tobacco: Never   Tobacco comments:    trying to quit 3 cig per day  Vaping Use   Vaping Use: Never used  Substance and Sexual Activity   Alcohol use: Yes    Alcohol/week: 21.0 standard drinks of alcohol    Types: 21 Standard drinks or  equivalent per week    Comment: occ   Drug use: No   Sexual activity: Not on file

## 2023-01-24 MED ORDER — METHYLPREDNISOLONE ACETATE 40 MG/ML IJ SUSP
40.0000 mg | INTRAMUSCULAR | Status: AC | PRN
  Administered 2023-01-23: 40 mg via INTRA_ARTICULAR

## 2023-01-24 MED ORDER — LIDOCAINE HCL 1 % IJ SOLN
3.0000 mL | INTRAMUSCULAR | Status: AC | PRN
  Administered 2023-01-23: 3 mL

## 2023-02-10 ENCOUNTER — Ambulatory Visit (INDEPENDENT_AMBULATORY_CARE_PROVIDER_SITE_OTHER): Payer: BC Managed Care – PPO | Admitting: Physician Assistant

## 2023-02-10 ENCOUNTER — Encounter: Payer: Self-pay | Admitting: Physician Assistant

## 2023-02-10 DIAGNOSIS — M1612 Unilateral primary osteoarthritis, left hip: Secondary | ICD-10-CM

## 2023-02-10 NOTE — Progress Notes (Signed)
HPI: [name] comes back in today for follow-up of his left knee status post injection 2 weeks ago.  He states the injection helped for couple days giving minimal relief.  His main complaint today is left hip.  He is having pain lateral aspect and in the groin area.  Notes he has difficulty getting out of cars.  He also notes that he is able to put on shoes and socks on the right but has a lot of difficulty putting on shoes and socks on the left.  He feels that the hip is causing most of his pain down the into his knee again he has a history of a left tibial plateau fracture some 20 years ago.  He is diabetic and his last hemoglobin A1c was 6.4.  He states at this point in time the pain on the left is affecting his quality of life.  He is unable play golf or play pickle ball due to the pain.  Review of systems: Denies any fevers chills ongoing infections chest pain shortness of breath.  Physical exam: General well-developed well-nourished male who walks with a slight antalgic gait no assistive device.  Left knee full range of motion.  Nontender with palpation along medial lateral joint line.  No abnormal warmth erythema or effusion Bilateral hips: Right hip he has good external rotation.  Limited internal rotation with minimal discomfort.  Left hip he has good external rotation and virtually no internal rotation is because of pain.  Impression: Left hip arthritis Right hip arthritis Posttraumatic left knee arthritis  Plan: Given patient's pain and failure of conservative treatment which is included meloxicam and modification of activities left total hip arthroplasty.  Again x-rays of the left hip show significant joint space narrowing and femoral acetabular impingement.  Surgical procedures discussed with patient by myself and Dr. Magnus Ivan.  Postoperative protocol discussed.  Risk benefits surgery reviewed.  Risk include but is not limited to leg length discrepancy, DVT/PE, wound healing problems,  section, nerve vessel injury, blood loss and prolonged pain.  Will work on scheduling for left total hip arthroplasty in the near future.

## 2023-03-11 DIAGNOSIS — G4733 Obstructive sleep apnea (adult) (pediatric): Secondary | ICD-10-CM | POA: Diagnosis not present

## 2023-03-21 ENCOUNTER — Encounter (HOSPITAL_COMMUNITY): Payer: Self-pay

## 2023-03-26 NOTE — Patient Instructions (Addendum)
DUE TO COVID-19 ONLY TWO VISITORS  (aged 57 and older)  ARE ALLOWED TO COME WITH YOU AND STAY IN THE WAITING ROOM ONLY DURING PRE OP AND PROCEDURE.   **NO VISITORS ARE ALLOWED IN THE SHORT STAY AREA OR RECOVERY ROOM!!**  IF YOU WILL BE ADMITTED INTO THE HOSPITAL YOU ARE ALLOWED ONLY FOUR SUPPORT PEOPLE DURING VISITATION HOURS ONLY (7 AM -8PM)   The support person(s) must pass our screening, gel in and out, and wear a mask at all times, including in the patient's room. Patients must also wear a mask when staff or their support person are in the room. Visitors GUEST BADGE MUST BE WORN VISIBLY  One adult visitor may remain with you overnight and MUST be in the room by 8 P.M.     Your procedure is scheduled on: 04/04/23   Report to [facility] Main Entrance    Report to admitting at : 6:00 AM   Call this number if you have problems the morning of surgery [phone]   Do not eat food :After Midnight.   After Midnight you may have the following liquids until : 5:30 AM DAY OF SURGERY  Water Black Coffee (sugar ok, NO MILK/CREAM OR CREAMERS)  Tea (sugar ok, NO MILK/CREAM OR CREAMERS) regular and decaf                             Plain Jell-O (NO RED)                                           Fruit ices (not with fruit pulp, NO RED)                                     Popsicles (NO RED)                                                                  Juice: apple, WHITE grape, WHITE cranberry Sports drinks like Gatorade (NO RED)   The day of surgery:  Drink ONE (1) Pre-Surgery Clear G2 at : 5:30 AM the morning of surgery. Drink in one sitting. Do not sip.  This drink was given to you during your hospital  pre-op appointment visit. Nothing else to drink after completing the  Pre-Surgery Clear Ensure or G2.          If you have questions, please contact your surgeon's office.   Oral Hygiene is also important to reduce your risk of infection.                                     Remember - BRUSH YOUR TEETH THE MORNING OF SURGERY WITH YOUR REGULAR TOOTHPASTE  DENTURES WILL BE REMOVED PRIOR TO SURGERY PLEASE DO NOT APPLY "Poly grip" OR ADHESIVES!!!   Do NOT smoke after Midnight   Take these medicines the morning of surgery with A SIP OF WATER: sertraline,metoprolol.  DO NOT TAKE ANY ORAL DIABETIC  MEDICATIONS DAY OF YOUR SURGERY                              You may not have any metal on your body including hair pins, jewelry, and body piercing             Do not wear lotions, powders, perfumes/cologne, or deodorant              Men may shave face and neck.   Do not bring valuables to the hospital. [facility] IS NOT             RESPONSIBLE   FOR VALUABLES.   Contacts, glasses, or bridgework may not be worn into surgery.   Bring small overnight bag day of surgery.   DO NOT BRING YOUR HOME MEDICATIONS TO THE HOSPITAL. PHARMACY WILL DISPENSE MEDICATIONS LISTED ON YOUR MEDICATION LIST TO YOU DURING YOUR ADMISSION IN THE HOSPITAL!    Patients discharged on the day of surgery will not be allowed to drive home.  Someone NEEDS to stay with you for the first 24 hours after anesthesia.   Special Instructions: Bring a copy of your healthcare power of attorney and living will documents         the day of surgery if you haven't scanned them before.              Please read over the following fact sheets you were given: IF YOU HAVE QUESTIONS ABOUT YOUR PRE-OP INSTRUCTIONS PLEASE CALL [phone]      Pre-operative 5 CHG Bath Instructions   You can play a key role in reducing the risk of infection after surgery. Your skin needs to be as free of germs as possible. You can reduce the number of germs on your skin by washing with CHG (chlorhexidine gluconate) soap before surgery. CHG is an antiseptic soap that kills germs and continues to kill germs even after washing.   DO NOT use if you have an allergy to chlorhexidine/CHG or antibacterial soaps. If your skin becomes  reddened or irritated, stop using the CHG and notify one of our RNs at [phone].   Please shower with the CHG soap starting 4 days before surgery using the following schedule:     Please keep in mind the following:  DO NOT shave, including legs and underarms, starting the day of your first shower.   You may shave your face at any point before/day of surgery.  Place clean sheets on your bed the day you start using CHG soap. Use a clean washcloth (not used since being washed) for each shower. DO NOT sleep with pets once you start using the CHG.   CHG Shower Instructions:  If you choose to wash your hair and private area, wash first with your normal shampoo/soap.  After you use shampoo/soap, rinse your hair and body thoroughly to remove shampoo/soap residue.  Turn the water OFF and apply about 3 tablespoons (45 ml) of CHG soap to a CLEAN washcloth.  Apply CHG soap ONLY FROM YOUR NECK DOWN TO YOUR TOES (washing for 3-5 minutes)  DO NOT use CHG soap on face, private areas, open wounds, or sores.  Pay special attention to the area where your surgery is being performed.  If you are having back surgery, having someone wash your back for you may be helpful. Wait 2 minutes after CHG soap is applied, then you may rinse off the CHG  soap.  Pat dry with a clean towel  Put on clean clothes/pajamas   If you choose to wear lotion, please use ONLY the CHG-compatible lotions on the back of this paper.     Additional instructions for the day of surgery: DO NOT APPLY any lotions, deodorants, cologne, or perfumes.   Put on clean/comfortable clothes.  Brush your teeth.  Ask your nurse before applying any prescription medications to the skin.      CHG Compatible Lotions   Aveeno Moisturizing lotion  Cetaphil Moisturizing Cream  Cetaphil Moisturizing Lotion  Clairol Herbal Essence Moisturizing Lotion, Dry Skin  Clairol Herbal Essence Moisturizing Lotion, Extra Dry Skin  Clairol Herbal Essence  Moisturizing Lotion, Normal Skin  Curel Age Defying Therapeutic Moisturizing Lotion with Alpha Hydroxy  Curel Extreme Care Body Lotion  Curel Soothing Hands Moisturizing Hand Lotion  Curel Therapeutic Moisturizing Cream, Fragrance-Free  Curel Therapeutic Moisturizing Lotion, Fragrance-Free  Curel Therapeutic Moisturizing Lotion, Original Formula  Eucerin Daily Replenishing Lotion  Eucerin Dry Skin Therapy Plus Alpha Hydroxy Crme  Eucerin Dry Skin Therapy Plus Alpha Hydroxy Lotion  Eucerin Original Crme  Eucerin Original Lotion  Eucerin Plus Crme Eucerin Plus Lotion  Eucerin TriLipid Replenishing Lotion  Keri Anti-Bacterial Hand Lotion  Keri Deep Conditioning Original Lotion Dry Skin Formula Softly Scented  Keri Deep Conditioning Original Lotion, Fragrance Free Sensitive Skin Formula  Keri Lotion Fast Absorbing Fragrance Free Sensitive Skin Formula  Keri Lotion Fast Absorbing Softly Scented Dry Skin Formula  Keri Original Lotion  Keri Skin Renewal Lotion Keri Silky Smooth Lotion  Keri Silky Smooth Sensitive Skin Lotion  Nivea Body Creamy Conditioning Oil  Nivea Body Extra Enriched Lotion  Nivea Body Original Lotion  Nivea Body Sheer Moisturizing Lotion Nivea Crme  Nivea Skin Firming Lotion  NutraDerm 30 Skin Lotion  NutraDerm Skin Lotion  NutraDerm Therapeutic Skin Cream  NutraDerm Therapeutic Skin Lotion  ProShield Protective Hand Cream  Provon moisturizing lotion   Incentive Spirometer  An incentive spirometer is a tool that can help keep your lungs clear and active. This tool measures how well you are filling your lungs with each breath. Taking long deep breaths may help reverse or decrease the chance of developing breathing (pulmonary) problems (especially infection) following: A long period of time when you are unable to move or be active. BEFORE THE PROCEDURE  If the spirometer includes an indicator to show your best effort, your nurse or respiratory therapist will set  it to a desired goal. If possible, sit up straight or lean slightly forward. Try not to slouch. Hold the incentive spirometer in an upright position. INSTRUCTIONS FOR USE  Sit on the edge of your bed if possible, or sit up as far as you can in bed or on a chair. Hold the incentive spirometer in an upright position. Breathe out normally. Place the mouthpiece in your mouth and seal your lips tightly around it. Breathe in slowly and as deeply as possible, raising the piston or the ball toward the top of the column. Hold your breath for 3-5 seconds or for as long as possible. Allow the piston or ball to fall to the bottom of the column. Remove the mouthpiece from your mouth and breathe out normally. Rest for a few seconds and repeat Steps 1 through 7 at least 10 times every 1-2 hours when you are awake. Take your time and take a few normal breaths between deep breaths. The spirometer may include an indicator to show your best effort. Use the  indicator as a goal to work toward during each repetition. After each set of 10 deep breaths, practice coughing to be sure your lungs are clear. If you have an incision (the cut made at the time of surgery), support your incision when coughing by placing a pillow or rolled up towels firmly against it. Once you are able to get out of bed, walk around indoors and cough well. You may stop using the incentive spirometer when instructed by your caregiver.  RISKS AND COMPLICATIONS Take your time so you do not get dizzy or light-headed. If you are in pain, you may need to take or ask for pain medication before doing incentive spirometry. It is harder to take a deep breath if you are having pain. AFTER USE Rest and breathe slowly and easily. It can be helpful to keep track of a log of your progress. Your caregiver can provide you with a simple table to help with this. If you are using the spirometer at home, follow these instructions: SEEK MEDICAL CARE IF:  You are  having difficultly using the spirometer. You have trouble using the spirometer as often as instructed. Your pain medication is not giving enough relief while using the spirometer. You develop fever of 100.5 F (38.1 C) or higher. SEEK IMMEDIATE MEDICAL CARE IF:  You cough up bloody sputum that had not been present before. You develop fever of 102 F (38.9 C) or greater. You develop worsening pain at or near the incision site. MAKE SURE YOU:  Understand these instructions. Will watch your condition. Will get help right away if you are not doing well or get worse. Document Released: 02/10/2007 Document Revised: 12/23/2011 Document Reviewed: 04/13/2007 [facility] Patient Information 2014 [place], [place].   ________________________________________________________________________

## 2023-03-27 ENCOUNTER — Encounter (HOSPITAL_COMMUNITY)
Admission: RE | Admit: 2023-03-27 | Discharge: 2023-03-27 | Disposition: A | Discharge status: Home / Self Care | Payer: BC Managed Care – PPO | Source: Ambulatory Visit | Attending: Orthopaedic Surgery | Admitting: Orthopaedic Surgery

## 2023-03-27 ENCOUNTER — Encounter (HOSPITAL_COMMUNITY): Payer: Self-pay

## 2023-03-27 ENCOUNTER — Other Ambulatory Visit: Payer: Self-pay

## 2023-03-27 VITALS — BP 153/99 | HR 86 | Temp 98.5°F | Ht 70.0 in | Wt 248.0 lb

## 2023-03-27 DIAGNOSIS — M1612 Unilateral primary osteoarthritis, left hip: Secondary | ICD-10-CM

## 2023-03-27 DIAGNOSIS — Z7984 Long term (current) use of oral hypoglycemic drugs: Secondary | ICD-10-CM | POA: Insufficient documentation

## 2023-03-27 DIAGNOSIS — G4733 Obstructive sleep apnea (adult) (pediatric): Secondary | ICD-10-CM | POA: Diagnosis not present

## 2023-03-27 DIAGNOSIS — I10 Essential (primary) hypertension: Secondary | ICD-10-CM | POA: Diagnosis not present

## 2023-03-27 DIAGNOSIS — E119 Type 2 diabetes mellitus without complications: Secondary | ICD-10-CM

## 2023-03-27 DIAGNOSIS — F17200 Nicotine dependence, unspecified, uncomplicated: Secondary | ICD-10-CM | POA: Diagnosis not present

## 2023-03-27 DIAGNOSIS — I2510 Atherosclerotic heart disease of native coronary artery without angina pectoris: Secondary | ICD-10-CM

## 2023-03-27 DIAGNOSIS — Z01818 Encounter for other preprocedural examination: Secondary | ICD-10-CM

## 2023-03-27 HISTORY — DX: Essential (primary) hypertension: I10

## 2023-03-27 HISTORY — DX: Unspecified osteoarthritis, unspecified site: M19.90

## 2023-03-27 LAB — COMPREHENSIVE METABOLIC PANEL
ALT: 25 U/L (ref 0–44)
AST: 25 U/L (ref 15–41)
Albumin: 4.4 g/dL (ref 3.5–5.0)
Alkaline Phosphatase: 62 U/L (ref 38–126)
Anion gap: 8 (ref 5–15)
BUN: 19 mg/dL (ref 6–20)
CO2: 24 mmol/L (ref 22–32)
Calcium: 9.3 mg/dL (ref 8.9–10.3)
Chloride: 106 mmol/L (ref 98–111)
Creatinine, Ser: 0.71 mg/dL (ref 0.61–1.24)
GFR, Estimated: >60 mL/min (ref >60)
Glucose, Bld: 125 mg/dL — ABNORMAL HIGH (ref 70–99)
Potassium: 4.1 mmol/L (ref 3.5–5.1)
Sodium: 138 mmol/L (ref 135–145)
Total Bilirubin: 0.9 mg/dL (ref 0.3–1.2)
Total Protein: 7.7 g/dL (ref 6.5–8.1)

## 2023-03-27 LAB — CBC
HCT: 44.6 % (ref 39.0–52.0)
Hemoglobin: 15.2 g/dL (ref 13.0–17.0)
MCH: 35.8 pg — ABNORMAL HIGH (ref 26.0–34.0)
MCHC: 34.1 g/dL (ref 30.0–36.0)
MCV: 105.2 fL — ABNORMAL HIGH (ref 80.0–100.0)
Platelets: 144 10*3/uL — ABNORMAL LOW (ref 150–400)
RBC: 4.24 MIL/uL (ref 4.22–5.81)
RDW: 12.5 % (ref 11.5–15.5)
WBC: 5.1 10*3/uL (ref 4.0–10.5)
nRBC: 0 % (ref 0.0–0.2)

## 2023-03-27 LAB — TYPE AND SCREEN: Antibody Screen: NEGATIVE

## 2023-03-27 LAB — GLUCOSE, CAPILLARY: Glucose-Capillary: 119 mg/dL — ABNORMAL HIGH (ref 70–99)

## 2023-03-27 LAB — HEMOGLOBIN A1C
Hgb A1c MFr Bld: 6.1 % — ABNORMAL HIGH (ref 4.8–5.6)
Mean Plasma Glucose: 128.37 mg/dL

## 2023-03-27 LAB — SURGICAL PCR SCREEN
MRSA, PCR: NEGATIVE
Staphylococcus aureus: NEGATIVE

## 2023-03-27 NOTE — Progress Notes (Signed)
For Short Stay: COVID SWAB appointment date:  Bowel Prep reminder:   For Anesthesia: PCP - Peri Maris: NP Cardiologist - N/A  Chest x-ray -  EKG - 03/27/23 Stress Test -  ECHO -  Cardiac Cath -  Pacemaker/ICD device last checked: Pacemaker orders received: Device Rep notified:  Spinal Cord Stimulator:  Sleep Study - Yes CPAP - Yes  Fasting Blood Sugar - 100's Checks Blood Sugar ___1__ times a day Date and result of last Hgb A1c-  Last dose of GLP1 agonist- N/A GLP1 instructions:   Last dose of SGLT-2 inhibitors- N/A SGLT-2 instructions:   Blood Thinner Instructions: N/A Aspirin Instructions: Last Dose:  Activity level: Can go up a flight of stairs and activities of daily living without stopping and without chest pain and/or shortness of breath   Able to exercise without chest pain and/or shortness of breath   Anesthesia review: Hx: Smoker,HTN,DIA,OSA(CPAP),Heart murmur as a child.  Patient denies shortness of breath, fever, cough and chest pain at PAT appointment   Patient verbalized understanding of instructions that were given to them at the PAT appointment. Patient was also instructed that they will need to review over the PAT instructions again at home before surgery.

## 2023-03-28 ENCOUNTER — Encounter (HOSPITAL_COMMUNITY): Payer: Self-pay

## 2023-03-28 NOTE — Progress Notes (Incomplete)
Case: [id number] Date/Time: 04/04/23 0815   Procedure: LEFT TOTAL HIP ARTHROPLASTY ANTERIOR APPROACH (Left: Hip) - RNFA   Anesthesia type: Spinal   Pre-op diagnosis: left hip osteoarthritis   Location: [facility] ROOM 10 / [facility]   Surgeons: Kathryne Hitch, MD       DISCUSSION: [name] is a 57 yo male who presents to PAT prior to surgery listed above. PMH significant for every day smoking, HTN, T2DM, OSA (uses CPAP), hx of heart murmur (as a child).  No prior anesthesia complications  Patient follows with his PCP for his chronic medical issues which are moderately controlled. Last seen on 01/03/23. BP at PAT visit is 153/99. HgA1c is 6.1 on screening labs.  Due to hx of murmur as a child patient was seen by Christeen Douglas PA-C during PAT visit. No murmur was auscultated. Additionally there is no mention of murmur on multiple physical exams. He denies CP/SOB at PAT visit and has good functional status. No prior echo and has never seen Cardiology. Screening EKG remarkable for new bifasicular block. Referred to Cardiology for clearance.  Patient saw Cardiology on 6/17 for evaluation. Due to multiple risk factors for CAD and abnormal EKG a Lexiscan stress test was ordered and completed on 6/20. Per Dr. Jacinto Halim: "Inferior scar and EF 41% Intermediate risk. Proceed with surgery. I will read after my office"   VS: BP (!) 153/99   Pulse 86   Temp 36.9 C (Oral)   Ht 5\' 10"  (1.778 m)   Wt 112.5 kg   SpO2 97%   BMI 35.58 kg/m   PROVIDERS: Thomas Pilon, FNP   LABS: Labs reviewed: Acceptable for surgery. (all labs ordered are listed, but only abnormal results are displayed)  Labs Reviewed  HEMOGLOBIN A1C - Abnormal; Notable for the following components:      Result Value   Hgb A1c MFr Bld 6.1 (*)    All other components within normal limits  CBC - Abnormal; Notable for the following components:   MCV 105.2 (*)    MCH 35.8 (*)    Platelets 144 (*)    All other components within  normal limits  COMPREHENSIVE METABOLIC PANEL - Abnormal; Notable for the following components:   Glucose, Bld 125 (*)    All other components within normal limits  GLUCOSE, CAPILLARY - Abnormal; Notable for the following components:   Glucose-Capillary 119 (*)    All other components within normal limits  SURGICAL PCR SCREEN  TYPE AND SCREEN     IMAGES: n/a   EKG 03/31/23:  Per Dr. Jacinto Halim: Normal sinus rhythm with rate of 88 bpm, leftward enlargement, left axis deviation, left anterior fascicular block.  Right bundle branch block.  Bifascicular block.  No evidence of ischemia.    CV: n/a  Past Medical History:  Diagnosis Date   Anxiety    Arthritis    Depression    Diabetes mellitus without complication (HCC)    type 2   Heart murmur    childhood heart murmur   High cholesterol    History of kidney stones    Hypertension     Past Surgical History:  Procedure Laterality Date   CYSTOSCOPY WITH URETEROSCOPY AND STENT PLACEMENT Left 05/18/2013   Procedure: LEFT URETEROSCOPY, Retrograde Pyelogram, Left Ureteral Dilation,STONE EXTRACTION, STENT PLACEMENT ;  Surgeon: Anner Crete, MD;  Location: [facility];  Service: Urology;  Laterality: Left;   CYSTOSCOPY/URETEROSCOPY/HOLMIUM LASER/STENT PLACEMENT Right 03/16/2015   Procedure: CYSTOSCOPY/URETEROSCOPY/STONE EXTRACTION/HOLMIUM LASER/STENT  PLACEMENT;  Surgeon: Bjorn Pippin, MD;  Location: [facility];  Service: Urology;  Laterality: Right;   EXTRACORPOREAL SHOCK WAVE LITHOTRIPSY Right 02/20/2017   Procedure: RIGHT EXTRACORPOREAL SHOCK WAVE LITHOTRIPSY (ESWL);  Surgeon: Malen Gauze, MD;  Location[facility];  Service: Urology;  Laterality: Right;   EXTRACORPOREAL SHOCK WAVE LITHOTRIPSY Left 02/25/2019   Procedure: EXTRACORPOREAL SHOCK WAVE LITHOTRIPSY (ESWL);  Surgeon: Alfredo Martinez, MD;  Location[facility];  Service: Urology;  Laterality: Left;   HOLMIUM LASER APPLICATION Left 05/18/2013   Procedure:  HOLMIUM LASER APPLICATION;  Surgeon: Anner Crete, MD;  Location: [facility];  Service: Urology;  Laterality: Left;   KNEE ARTHROSCOPY Right 09/2016   KNEE ARTHROSCOPY Left    reconstruction    ORIF LEFT PROXIMAL TIBIAL FX  2006   VASECTOMY Bilateral 05/18/2013   Procedure: BILATERAL VASECTOMY;  Surgeon: Anner Crete, MD;  Location: [facility];  Service: Urology;  Laterality: Bilateral;    MEDICATIONS:  glucose blood test strip   meloxicam (MOBIC) 7.5 MG tablet   Menthol, Topical Analgesic, (BIOFREEZE EX)   metFORMIN (GLUCOPHAGE-XR) 500 MG 24 hr tablet   metoprolol succinate (TOPROL-XL) 100 MG 24 hr tablet   pravastatin (PRAVACHOL) 20 MG tablet   sertraline (ZOLOFT) 100 MG tablet   sildenafil (VIAGRA) 50 MG tablet   No current facility-administered medications for this encounter.   Marcille Blanco [facility] Short Stay/Anesthesiology [facility] Phone [phone] 04/03/2023 2:54 PM

## 2023-03-31 ENCOUNTER — Encounter: Payer: Self-pay | Admitting: Cardiology

## 2023-03-31 ENCOUNTER — Ambulatory Visit: Payer: BC Managed Care – PPO | Admitting: Cardiology

## 2023-03-31 VITALS — BP 160/100 | HR 93 | Ht 70.0 in | Wt 254.0 lb

## 2023-03-31 DIAGNOSIS — R9431 Abnormal electrocardiogram [ECG] [EKG]: Secondary | ICD-10-CM | POA: Diagnosis not present

## 2023-03-31 DIAGNOSIS — E119 Type 2 diabetes mellitus without complications: Secondary | ICD-10-CM

## 2023-03-31 DIAGNOSIS — E7800 Pure hypercholesterolemia, unspecified: Secondary | ICD-10-CM | POA: Diagnosis not present

## 2023-03-31 DIAGNOSIS — I10 Essential (primary) hypertension: Secondary | ICD-10-CM | POA: Diagnosis not present

## 2023-03-31 DIAGNOSIS — Z01810 Encounter for preprocedural cardiovascular examination: Secondary | ICD-10-CM

## 2023-03-31 MED ORDER — AMLODIPINE BESYLATE 5 MG PO TABS
5.0000 mg | ORAL_TABLET | ORAL | 3 refills | Status: DC
Start: 2023-03-31 — End: 2023-06-02

## 2023-03-31 MED ORDER — OLMESARTAN MEDOXOMIL-HCTZ 20-12.5 MG PO TABS
1.0000 | ORAL_TABLET | ORAL | 1 refills | Status: DC
Start: 2023-03-31 — End: 2023-05-23

## 2023-03-31 MED ORDER — ROSUVASTATIN CALCIUM 20 MG PO TABS
20.0000 mg | ORAL_TABLET | Freq: Every day | ORAL | 3 refills | Status: DC
Start: 2023-03-31 — End: 2023-06-11

## 2023-03-31 NOTE — Progress Notes (Signed)
Primary Physician/Referring:  Soundra Pilon, FNP  Patient ID: [name], male    DOB: [birth year], 57 y.o.   MRN: [id number]  Chief Complaint  Patient presents with   New Patient (Initial Visit)   Establish Care   HPI:    [name]  is a 57 y.o. Caucasian male patient with hypertension, hypercholesterolemia, diabetes mellitus, tobacco use disorder, obesity referred to me on urgent basis for upcoming left hip arthroplasty due to abnormal EKG. patient remains asymptomatic and states that he has not been able to exercise due to left hip pain.  He also has developed arthritis in his left knee as well.  He is accompanied by his wife.  Past Medical History:  Diagnosis Date   Anxiety    Arthritis    Depression    Diabetes mellitus without complication (HCC)    type 2   Heart murmur    childhood heart murmur   High cholesterol    History of kidney stones    Hypertension    Past Surgical History:  Procedure Laterality Date   CYSTOSCOPY WITH URETEROSCOPY AND STENT PLACEMENT Left 05/18/2013   Procedure: LEFT URETEROSCOPY, Retrograde Pyelogram, Left Ureteral Dilation,STONE EXTRACTION, STENT PLACEMENT ;  Surgeon: Anner Crete, MD;  Location: [facility];  Service: Urology;  Laterality: Left;   CYSTOSCOPY/URETEROSCOPY/HOLMIUM LASER/STENT PLACEMENT Right 03/16/2015   Procedure: CYSTOSCOPY/URETEROSCOPY/STONE EXTRACTION/HOLMIUM LASER/STENT PLACEMENT;  Surgeon: Bjorn Pippin, MD;  Location: [facility];  Service: Urology;  Laterality: Right;   EXTRACORPOREAL SHOCK WAVE LITHOTRIPSY Right 02/20/2017   Procedure: RIGHT EXTRACORPOREAL SHOCK WAVE LITHOTRIPSY (ESWL);  Surgeon: Malen Gauze, MD;  Location: [facility];  Service: Urology;  Laterality: Right;   EXTRACORPOREAL SHOCK WAVE LITHOTRIPSY Left 02/25/2019   Procedure: EXTRACORPOREAL SHOCK WAVE LITHOTRIPSY (ESWL);  Surgeon: Alfredo Martinez, MD;  Location: [facility];  Service: Urology;  Laterality: Left;    HOLMIUM LASER APPLICATION Left 05/18/2013   Procedure: HOLMIUM LASER APPLICATION;  Surgeon: Anner Crete, MD;  Location: [facility];  Service: Urology;  Laterality: Left;   KNEE ARTHROSCOPY Right 09/2016   KNEE ARTHROSCOPY Left    reconstruction    KNEE SURGERY     ORIF LEFT PROXIMAL TIBIAL FX  2006   VASECTOMY Bilateral 05/18/2013   Procedure: BILATERAL VASECTOMY;  Surgeon: Anner Crete, MD;  Location: [facility];  Service: Urology;  Laterality: Bilateral;   Family History  Problem Relation Age of Onset   Stroke Mother    Hypertension Father    Heart attack Father    Diabetes Father     Social History   Tobacco Use   Smoking status: Every Day    Packs/day: 1.00    Years: 26.00    Additional pack years: 0.00    Total pack years: 26.00    Types: Cigarettes   Smokeless tobacco: Never   Tobacco comments:    trying to quit 3 cig per day  Substance Use Topics   Alcohol use: Yes    Alcohol/week: 2.0 standard drinks of alcohol    Types: 2 Standard drinks or equivalent per week    Comment: daily   Marital Status: Married  ROS  Review of Systems  Cardiovascular:  Negative for chest pain, dyspnea on exertion and leg swelling.   Objective      03/31/2023    2:32 PM 03/31/2023    1:16 PM 03/31/2023    1:09 PM  Vitals with BMI  Height  5\' 10"   Weight   254 lbs  BMI   36.45  Systolic 160 164 161  Diastolic 100 114 096  Pulse  93 91   Blood pressure (!) 160/100, pulse 93, height 5\' 10"  (1.778 m), weight 254 lb (115.2 kg), SpO2 97 %.  Physical Exam Constitutional:      Appearance: He is obese.  Neck:     Vascular: No carotid bruit or JVD.  Cardiovascular:     Rate and Rhythm: Normal rate and regular rhythm.     Pulses: Intact distal pulses.     Heart sounds: Normal heart sounds. No murmur heard.    No gallop.  Pulmonary:     Effort: Pulmonary effort is normal.     Breath sounds: Normal breath sounds.  Abdominal:     General: Bowel  sounds are normal.     Palpations: Abdomen is soft.  Musculoskeletal:     Right lower leg: No edema.     Left lower leg: No edema.    Laboratory examination:   Recent Labs    03/27/23 0930  NA 138  K 4.1  CL 106  CO2 24  GLUCOSE 125*  BUN 19  CREATININE 0.71  CALCIUM 9.3  GFRNONAA >60    Lab Results  Component Value Date   GLUCOSE 125 (H) 03/27/2023   NA 138 03/27/2023   K 4.1 03/27/2023   CL 106 03/27/2023   CO2 24 03/27/2023   BUN 19 03/27/2023   CREATININE 0.71 03/27/2023   GFRNONAA >60 03/27/2023   CALCIUM 9.3 03/27/2023   PROT 7.7 03/27/2023   ALBUMIN 4.4 03/27/2023   BILITOT 0.9 03/27/2023   ALKPHOS 62 03/27/2023   AST 25 03/27/2023   ALT 25 03/27/2023   ANIONGAP 8 03/27/2023      Lab Results  Component Value Date   ALT 25 03/27/2023   AST 25 03/27/2023   ALKPHOS 62 03/27/2023   BILITOT 0.9 03/27/2023       Latest Ref Rng & Units 03/27/2023    9:30 AM  Hepatic Function  Total Protein 6.5 - 8.1 g/dL 7.7   Albumin 3.5 - 5.0 g/dL 4.4   AST 15 - 41 U/L 25   ALT 0 - 44 U/L 25   Alk Phosphatase 38 - 126 U/L 62   Total Bilirubin 0.3 - 1.2 mg/dL 0.9     Lipid Panel No results for input(s): "CHOL", "TRIG", "LDLCALC", "VLDL", "HDL", "CHOLHDL", "LDLDIRECT" in the last 8760 hours.  HEMOGLOBIN A1C Lab Results  Component Value Date   HGBA1C 6.1 (H) 03/27/2023   MPG 128.37 03/27/2023   No results found for: "TSH"    External labs:   Labs 06/22/2022:  Total cholesterol 200, triglycerides 123, HDL 67, LDL 112, non-HDL cholesterol 113.  TSH normal at 2.25.  A1c 6.0%.  Serum glucose 122, BUN 20, creatinine 0.88, EGFR 100 more mL, potassium 4.2, LFTs normal.  Hb 15.4/HCT 44.6, platelets 141.  Radiology:    Cardiac Studies:   NA  EKG:   EKG 03/31/2023: Normal sinus rhythm with rate of 88 bpm, leftward enlargement, left axis deviation, left anterior fascicular block.  Right bundle branch block.  Bifascicular block.  No evidence of  ischemia.  EKG 03/27/2023: Normal sinus rhythm at rate of 83 bpm, left axis deviation, left intrafascicular block.  Right bundle branch block.  Bifascicular block.  Right bundle branch block new compared to 05/09/2005.    Medications and allergies  No Known Allergies   Medication  list   Current Outpatient Medications:    amLODipine (NORVASC) 5 MG tablet, Take 1 tablet (5 mg total) by mouth every morning., Disp: 30 tablet, Rfl: 3   glucose blood test strip, 1 each by Other route as needed for other. Use as instructed, Disp: , Rfl:    meloxicam (MOBIC) 7.5 MG tablet, TAKE 1 TABLET(7.5 MG) BY MOUTH TWICE DAILY (Patient taking differently: Take 7.5 mg by mouth daily.), Disp: 60 tablet, Rfl: 2   Menthol, Topical Analgesic, (BIOFREEZE EX), Apply 1 Application topically daily as needed (knee pain)., Disp: , Rfl:    metFORMIN (GLUCOPHAGE-XR) 500 MG 24 hr tablet, Take 500 mg by mouth every evening., Disp: , Rfl:    metoprolol succinate (TOPROL-XL) 100 MG 24 hr tablet, Take 100 mg by mouth daily., Disp: , Rfl:    olmesartan-hydrochlorothiazide (BENICAR HCT) 20-12.5 MG tablet, Take 1 tablet by mouth every morning., Disp: 30 tablet, Rfl: 1   rosuvastatin (CRESTOR) 20 MG tablet, Take 1 tablet (20 mg total) by mouth daily., Disp: 90 tablet, Rfl: 3   sertraline (ZOLOFT) 100 MG tablet, Take 150 mg by mouth daily., Disp: , Rfl:    sildenafil (VIAGRA) 50 MG tablet, Take 50 mg by mouth daily as needed for erectile dysfunction. 1-2 daily prn, Disp: , Rfl:   Assessment     ICD-10-CM   1. Pre-operative cardiovascular examination  Z01.810 EKG 12-Lead    PCV ECHOCARDIOGRAM COMPLETE    PCV MYOCARDIAL PERFUSION WITH LEXISCAN    2. Nonspecific abnormal electrocardiogram (ECG) (EKG)  R94.31 Basic metabolic panel    Pro b natriuretic peptide (BNP)    PCV ECHOCARDIOGRAM COMPLETE    PCV MYOCARDIAL PERFUSION WITH LEXISCAN    3. Hypercholesteremia  E78.00 rosuvastatin (CRESTOR) 20 MG tablet    4. Type 2 diabetes  mellitus without complication, without long-term current use of insulin (HCC)  E11.9 PCV ECHOCARDIOGRAM COMPLETE    PCV MYOCARDIAL PERFUSION WITH LEXISCAN    5. Primary hypertension  I10 olmesartan-hydrochlorothiazide (BENICAR HCT) 20-12.5 MG tablet    Basic metabolic panel    Pro b natriuretic peptide (BNP)    amLODipine (NORVASC) 5 MG tablet       Orders Placed This Encounter  Procedures   Basic metabolic panel   Pro b natriuretic peptide (BNP)   PCV MYOCARDIAL PERFUSION WITH LEXISCAN    Standing Status:   Future    Standing Expiration Date:   05/31/2023   EKG 12-Lead   PCV ECHOCARDIOGRAM COMPLETE    Standing Status:   Future    Standing Expiration Date:   03/30/2024    Meds ordered this encounter  Medications   rosuvastatin (CRESTOR) 20 MG tablet    Sig: Take 1 tablet (20 mg total) by mouth daily.    Dispense:  90 tablet    Refill:  3    Discontinue pravastatin   olmesartan-hydrochlorothiazide (BENICAR HCT) 20-12.5 MG tablet    Sig: Take 1 tablet by mouth every morning.    Dispense:  30 tablet    Refill:  1   amLODipine (NORVASC) 5 MG tablet    Sig: Take 1 tablet (5 mg total) by mouth every morning.    Dispense:  30 tablet    Refill:  3    Medications Discontinued During This Encounter  Medication Reason   pravastatin (PRAVACHOL) 20 MG tablet Change in therapy     Recommendations:   [name] is a 57 y.o. Caucasian male patient with hypertension, hypercholesterolemia, diabetes mellitus, tobacco  use disorder, obesity referred to me on urgent basis for upcoming left hip arthroplasty due to abnormal EKG.  1. Pre-operative cardiovascular examination Patient has significant cardiovascular is factors that include but not limited to diabetes mellitus, hypertension which is uncontrolled, hypercholesterolemia and obesity along with tobacco use.  Will try to get nuclear stress test prior to his scheduled left hip arthroplasty this week Friday in 4 days. - EKG 12-Lead -  PCV ECHOCARDIOGRAM COMPLETE; Future - PCV MYOCARDIAL PERFUSION WITH LEXISCAN; Future  2. Nonspecific abnormal electrocardiogram (ECG) (EKG) EKG reveals bifascicular block.  - Basic metabolic panel - Pro b natriuretic peptide (BNP) - PCV ECHOCARDIOGRAM COMPLETE; Future - PCV MYOCARDIAL PERFUSION WITH LEXISCAN; Future  3. Hypercholesteremia Reviewed his lipid status, in view of diabetes mellitus and about risk factors, goal LDL <70, patient is presently only 57 years of age.  Will discontinue pravastatin and switch him to Crestor 20 mg daily. - rosuvastatin (CRESTOR) 20 MG tablet; Take 1 tablet (20 mg total) by mouth daily.  Dispense: 90 tablet; Refill: 3  4. Type 2 diabetes mellitus without complication, without long-term current use of insulin (HCC) In view of the risk factors, nuclear stress test and echocardiogram ordered. - PCV ECHOCARDIOGRAM COMPLETE; Future - PCV MYOCARDIAL PERFUSION WITH LEXISCAN; Future  5. Primary hypertension Blood pressure is markedly elevated.  Will start him on olmesartan HCT 20/12.5 mg along with amlodipine 5 mg daily, he will continue with metoprolol succinate 100 mg daily.  Obtain BMP and NT proBNP in 2 to 3 weeks after starting Benicar HCT. Will see him back in 2 months for follow-up.  Hopefully he will have surgery this coming week if stress test is low risk. - olmesartan-hydrochlorothiazide (BENICAR HCT) 20-12.5 MG tablet; Take 1 tablet by mouth every morning.  Dispense: 30 tablet; Refill: 1 - Basic metabolic panel - Pro b natriuretic peptide (BNP) - amLODipine (NORVASC) 5 MG tablet; Take 1 tablet (5 mg total) by mouth every morning.  Dispense: 30 tablet; Refill: 3     Yates Decamp, MD, [facility] 03/31/2023, 11:03 PM Office: [phone]

## 2023-04-01 MED ORDER — OXYCODONE HCL 5 MG PO TABS
ORAL_TABLET | ORAL | Status: AC
  Filled 2023-04-01: qty 1

## 2023-04-03 ENCOUNTER — Ambulatory Visit: Payer: BC Managed Care – PPO

## 2023-04-03 ENCOUNTER — Encounter: Payer: Self-pay | Admitting: Cardiology

## 2023-04-03 DIAGNOSIS — E119 Type 2 diabetes mellitus without complications: Secondary | ICD-10-CM

## 2023-04-03 DIAGNOSIS — Z01810 Encounter for preprocedural cardiovascular examination: Secondary | ICD-10-CM | POA: Diagnosis not present

## 2023-04-03 DIAGNOSIS — R9431 Abnormal electrocardiogram [ECG] [EKG]: Secondary | ICD-10-CM

## 2023-04-03 DIAGNOSIS — M1612 Unilateral primary osteoarthritis, left hip: Secondary | ICD-10-CM | POA: Insufficient documentation

## 2023-04-03 LAB — PCV MYOCARDIAL PERFUSION WITH LEXISCAN: ST Depression (mm): 0 mm

## 2023-04-03 NOTE — H&P (Signed)
TOTAL HIP ADMISSION H&P  Patient is admitted for left total hip arthroplasty.  Subjective:  Chief Complaint: left hip pain  HPI: [name], 57 y.o. male, has a history of pain and functional disability in the left hip(s) due to arthritis and patient has failed non-surgical conservative treatments for greater than 12 weeks to include NSAID's and/or analgesics, corticosteriod injections, flexibility and strengthening excercises, weight reduction as appropriate, and activity modification.  Onset of symptoms was gradual starting 3 years ago with gradually worsening course since that time.The patient noted no past surgery on the left hip(s).  Patient currently rates pain in the left hip at 10 out of 10 with activity. Patient has night pain, worsening of pain with activity and weight bearing, pain that interfers with activities of daily living, and pain with passive range of motion. Patient has evidence of subchondral sclerosis, periarticular osteophytes, and joint space narrowing by imaging studies. This condition presents safety issues increasing the risk of falls.  There is no current active infection.  Patient Active Problem List   Diagnosis Date Noted   Unilateral primary osteoarthritis, left hip 04/03/2023   Right knee pain 08/09/2016   Past Medical History:  Diagnosis Date   Anxiety    Arthritis    Depression    Diabetes mellitus without complication (HCC)    type 2   Heart murmur    childhood heart murmur   High cholesterol    History of kidney stones    Hypertension     Past Surgical History:  Procedure Laterality Date   CYSTOSCOPY WITH URETEROSCOPY AND STENT PLACEMENT Left 05/18/2013   Procedure: LEFT URETEROSCOPY, Retrograde Pyelogram, Left Ureteral Dilation,STONE EXTRACTION, STENT PLACEMENT ;  Surgeon: Anner Crete, MD;  Location: [facility];  Service: Urology;  Laterality: Left;   CYSTOSCOPY/URETEROSCOPY/HOLMIUM LASER/STENT PLACEMENT Right 03/16/2015    Procedure: CYSTOSCOPY/URETEROSCOPY/STONE EXTRACTION/HOLMIUM LASER/STENT PLACEMENT;  Surgeon: Bjorn Pippin, MD;  Location: [facility];  Service: Urology;  Laterality: Right;   EXTRACORPOREAL SHOCK WAVE LITHOTRIPSY Right 02/20/2017   Procedure: RIGHT EXTRACORPOREAL SHOCK WAVE LITHOTRIPSY (ESWL);  Surgeon: Malen Gauze, MD;  Location: [facility];  Service: Urology;  Laterality: Right;   EXTRACORPOREAL SHOCK WAVE LITHOTRIPSY Left 02/25/2019   Procedure: EXTRACORPOREAL SHOCK WAVE LITHOTRIPSY (ESWL);  Surgeon: Alfredo Martinez, MD;  Location: [facility];  Service: Urology;  Laterality: Left;   HOLMIUM LASER APPLICATION Left 05/18/2013   Procedure: HOLMIUM LASER APPLICATION;  Surgeon: Anner Crete, MD;  Location: [facility];  Service: Urology;  Laterality: Left;   KNEE ARTHROSCOPY Right 09/2016   KNEE ARTHROSCOPY Left    reconstruction    KNEE SURGERY     ORIF LEFT PROXIMAL TIBIAL FX  2006   VASECTOMY Bilateral 05/18/2013   Procedure: BILATERAL VASECTOMY;  Surgeon: Anner Crete, MD;  Location: [facility];  Service: Urology;  Laterality: Bilateral;    No current facility-administered medications for this encounter.   Current Outpatient Medications  Medication Sig Dispense Refill Last Dose   meloxicam (MOBIC) 7.5 MG tablet TAKE 1 TABLET(7.5 MG) BY MOUTH TWICE DAILY (Patient taking differently: Take 7.5 mg by mouth daily.) 60 tablet 2    Menthol, Topical Analgesic, (BIOFREEZE EX) Apply 1 Application topically daily as needed (knee pain).      metFORMIN (GLUCOPHAGE-XR) 500 MG 24 hr tablet Take 500 mg by mouth every evening.      metoprolol succinate (TOPROL-XL) 100 MG 24 hr tablet Take 100 mg by mouth daily.  sertraline (ZOLOFT) 100 MG tablet Take 150 mg by mouth daily.      amLODipine (NORVASC) 5 MG tablet Take 1 tablet (5 mg total) by mouth every morning. 30 tablet 3    glucose blood test strip 1 each by Other route as needed for other. Use as  instructed      olmesartan-hydrochlorothiazide (BENICAR HCT) 20-12.5 MG tablet Take 1 tablet by mouth every morning. 30 tablet 1    rosuvastatin (CRESTOR) 20 MG tablet Take 1 tablet (20 mg total) by mouth daily. 90 tablet 3    sildenafil (VIAGRA) 50 MG tablet Take 50 mg by mouth daily as needed for erectile dysfunction. 1-2 daily prn      No Known Allergies  Social History   Tobacco Use   Smoking status: Every Day    Packs/day: 1.00    Years: 26.00    Additional pack years: 0.00    Total pack years: 26.00    Types: Cigarettes   Smokeless tobacco: Never   Tobacco comments:    trying to quit 3 cig per day  Substance Use Topics   Alcohol use: Yes    Alcohol/week: 2.0 standard drinks of alcohol    Types: 2 Standard drinks or equivalent per week    Comment: daily    Family History  Problem Relation Age of Onset   Stroke Mother    Hypertension Father    Heart attack Father    Diabetes Father      Review of Systems  Objective:  Physical Exam Vitals reviewed.  Constitutional:      Appearance: Normal appearance. He is obese.  HENT:     Head: Normocephalic and atraumatic.  Eyes:     Extraocular Movements: Extraocular movements intact.     Pupils: Pupils are equal, round, and reactive to light.  Cardiovascular:     Rate and Rhythm: Normal rate.     Pulses: Normal pulses.  Pulmonary:     Effort: Pulmonary effort is normal.     Breath sounds: Normal breath sounds.  Abdominal:     Palpations: Abdomen is soft.  Musculoskeletal:     Cervical back: Normal range of motion and neck supple.     Left hip: Tenderness and bony tenderness present. Decreased range of motion. Decreased strength.  Neurological:     Mental Status: He is alert and oriented to person, place, and time.  Psychiatric:        Behavior: Behavior normal.     Vital signs in last 24 hours:    Labs:   Estimated body mass index is 36.45 kg/m as calculated from the following:   Height as of 03/31/23: 5'  10" (1.778 m).   Weight as of 03/31/23: 115.2 kg.   Imaging Review Plain radiographs demonstrate severe degenerative joint disease of the left hip(s). The bone quality appears to be excellent for age and reported activity level.      Assessment/Plan:  End stage arthritis, left hip(s)  The patient history, physical examination, clinical judgement of the provider and imaging studies are consistent with end stage degenerative joint disease of the left hip(s) and total hip arthroplasty is deemed medically necessary. The treatment options including medical management, injection therapy, arthroscopy and arthroplasty were discussed at length. The risks and benefits of total hip arthroplasty were presented and reviewed. The risks due to aseptic loosening, infection, stiffness, dislocation/subluxation,  thromboembolic complications and other imponderables were discussed.  The patient acknowledged the explanation, agreed to proceed with the plan  and consent was signed. Patient is being admitted for inpatient treatment for surgery, pain control, PT, OT, prophylactic antibiotics, VTE prophylaxis, progressive ambulation and ADL's and discharge planning.The patient is planning to be discharged home with home health services

## 2023-04-03 NOTE — Anesthesia Preprocedure Evaluation (Addendum)
Anesthesia Evaluation  Patient identified by MRN, date of birth, ID band Patient awake    Reviewed: Allergy & Precautions, H&P , NPO status , Patient's Chart, lab work & pertinent test results  Airway Mallampati: II  TM Distance: >3 FB Neck ROM: Full    Dental no notable dental hx.    Pulmonary Current Smoker and Patient abstained from smoking.   Pulmonary exam normal breath sounds clear to auscultation       Cardiovascular hypertension, +CHF  Normal cardiovascular exam Rhythm:Regular Rate:Normal  EF 41%   Neuro/Psych   Anxiety Depression    negative neurological ROS     GI/Hepatic negative GI ROS, Neg liver ROS,,,  Endo/Other  diabetes, Type 2    Renal/GU negative Renal ROS  negative genitourinary   Musculoskeletal  (+) Arthritis , Osteoarthritis,    Abdominal   Peds negative pediatric ROS (+)  Hematology negative hematology ROS (+)   Anesthesia Other Findings   Reproductive/Obstetrics negative OB ROS                             Anesthesia Physical Anesthesia Plan  ASA: 3  Anesthesia Plan: Spinal   Post-op Pain Management: Minimal or no pain anticipated   Induction: Intravenous  PONV Risk Score and Plan: 1 and Propofol infusion, Treatment may vary due to age or medical condition and Ondansetron  Airway Management Planned: Simple Face Mask  Additional Equipment:   Intra-op Plan:   Post-operative Plan:   Informed Consent: I have reviewed the patients History and Physical, chart, labs and discussed the procedure including the risks, benefits and alternatives for the proposed anesthesia with the patient or authorized representative who has indicated his/her understanding and acceptance.     Dental advisory given  Plan Discussed with: CRNA and Surgeon  Anesthesia Plan Comments: (See PAT note from 6/13 by K Gekas PA-C. Nuclear med stress test results not available in epic  on 6/20. Per Dr. Jacinto Halim "Inferior scar and EF 41% Intermediate risk. Proceed with surgery." He will formally interpret study after clinic today (6/20) and should be available 6/21 in the AM. )        Anesthesia Quick Evaluation

## 2023-04-04 ENCOUNTER — Observation Stay (HOSPITAL_COMMUNITY): Payer: BC Managed Care – PPO

## 2023-04-04 ENCOUNTER — Encounter (HOSPITAL_COMMUNITY)
Admission: RE | Disposition: A | Discharge status: Home Health | Payer: Self-pay | Source: Home / Self Care | Attending: Orthopaedic Surgery

## 2023-04-04 ENCOUNTER — Ambulatory Visit (HOSPITAL_COMMUNITY): Payer: BC Managed Care – PPO | Admitting: Certified Registered"

## 2023-04-04 ENCOUNTER — Ambulatory Visit (HOSPITAL_COMMUNITY): Payer: BC Managed Care – PPO

## 2023-04-04 ENCOUNTER — Other Ambulatory Visit: Payer: Self-pay

## 2023-04-04 ENCOUNTER — Ambulatory Visit (HOSPITAL_COMMUNITY): Payer: BC Managed Care – PPO | Admitting: Medical

## 2023-04-04 ENCOUNTER — Observation Stay (HOSPITAL_COMMUNITY)
Admission: RE | Admit: 2023-04-04 | Discharge: 2023-04-05 | Disposition: A | Discharge status: Home Health | Payer: BC Managed Care – PPO | Attending: Orthopaedic Surgery | Admitting: Orthopaedic Surgery

## 2023-04-04 ENCOUNTER — Encounter (HOSPITAL_COMMUNITY): Payer: Self-pay | Admitting: Orthopaedic Surgery

## 2023-04-04 DIAGNOSIS — Z7984 Long term (current) use of oral hypoglycemic drugs: Secondary | ICD-10-CM | POA: Diagnosis not present

## 2023-04-04 DIAGNOSIS — F17210 Nicotine dependence, cigarettes, uncomplicated: Secondary | ICD-10-CM | POA: Insufficient documentation

## 2023-04-04 DIAGNOSIS — I10 Essential (primary) hypertension: Secondary | ICD-10-CM | POA: Diagnosis not present

## 2023-04-04 DIAGNOSIS — M1612 Unilateral primary osteoarthritis, left hip: Secondary | ICD-10-CM | POA: Diagnosis not present

## 2023-04-04 DIAGNOSIS — E119 Type 2 diabetes mellitus without complications: Secondary | ICD-10-CM | POA: Diagnosis not present

## 2023-04-04 DIAGNOSIS — Z471 Aftercare following joint replacement surgery: Secondary | ICD-10-CM | POA: Diagnosis not present

## 2023-04-04 DIAGNOSIS — Z96642 Presence of left artificial hip joint: Secondary | ICD-10-CM

## 2023-04-04 DIAGNOSIS — I110 Hypertensive heart disease with heart failure: Secondary | ICD-10-CM | POA: Diagnosis not present

## 2023-04-04 DIAGNOSIS — F17200 Nicotine dependence, unspecified, uncomplicated: Secondary | ICD-10-CM | POA: Diagnosis not present

## 2023-04-04 DIAGNOSIS — Z79899 Other long term (current) drug therapy: Secondary | ICD-10-CM | POA: Diagnosis not present

## 2023-04-04 DIAGNOSIS — I509 Heart failure, unspecified: Secondary | ICD-10-CM | POA: Diagnosis not present

## 2023-04-04 HISTORY — PX: TOTAL HIP ARTHROPLASTY: SHX124

## 2023-04-04 LAB — GLUCOSE, CAPILLARY
Glucose-Capillary: 159 mg/dL — ABNORMAL HIGH (ref 70–99)
Glucose-Capillary: 132 mg/dL — ABNORMAL HIGH (ref 70–99)

## 2023-04-04 LAB — ABO/RH: ABO/RH(D): O POS

## 2023-04-04 LAB — TYPE AND SCREEN: ABO/RH(D): O POS

## 2023-04-04 SURGERY — ARTHROPLASTY, HIP, TOTAL, ANTERIOR APPROACH
Anesthesia: Spinal | Site: Hip | Laterality: Left

## 2023-04-04 MED ORDER — IRBESARTAN 150 MG PO TABS
150.0000 mg | ORAL_TABLET | Freq: Every day | ORAL | Status: DC
  Administered 2023-04-05: 150 mg via ORAL
  Filled 2023-04-04: qty 1

## 2023-04-04 MED ORDER — BUPIVACAINE IN DEXTROSE 0.75-8.25 % IT SOLN
INTRATHECAL | Status: DC | PRN
  Administered 2023-04-04: 1.8 mL via INTRATHECAL

## 2023-04-04 MED ORDER — DEXAMETHASONE SODIUM PHOSPHATE 4 MG/ML IJ SOLN
INTRAMUSCULAR | Status: DC | PRN
  Administered 2023-04-04: 8 mg via INTRAVENOUS

## 2023-04-04 MED ORDER — INSULIN ASPART 100 UNIT/ML IJ SOLN: 0.0000 [IU] | INTRAMUSCULAR | Status: DC | PRN

## 2023-04-04 MED ORDER — ACETAMINOPHEN 325 MG PO TABS: 325.0000 mg | ORAL_TABLET | Freq: Four times a day (QID) | ORAL | Status: DC | PRN

## 2023-04-04 MED ORDER — PROPOFOL 500 MG/50ML IV EMUL
INTRAVENOUS | Status: DC | PRN
  Administered 2023-04-04: 40 ug/kg/min via INTRAVENOUS

## 2023-04-04 MED ORDER — PHENYLEPHRINE HCL (PRESSORS) 10 MG/ML IV SOLN
INTRAVENOUS | Status: AC
  Filled 2023-04-04: qty 1

## 2023-04-04 MED ORDER — ALUM & MAG HYDROXIDE-SIMETH 200-200-20 MG/5ML PO SUSP: 30.0000 mL | ORAL | Status: DC | PRN

## 2023-04-04 MED ORDER — OXYCODONE HCL 5 MG PO TABS: 10.0000 mg | ORAL_TABLET | ORAL | Status: DC | PRN

## 2023-04-04 MED ORDER — METOCLOPRAMIDE HCL 5 MG PO TABS: 5.0000 mg | ORAL_TABLET | Freq: Three times a day (TID) | ORAL | Status: DC | PRN

## 2023-04-04 MED ORDER — ONDANSETRON HCL 4 MG/2ML IJ SOLN: 4.0000 mg | Freq: Four times a day (QID) | INTRAMUSCULAR | Status: DC | PRN

## 2023-04-04 MED ORDER — HYDROCHLOROTHIAZIDE 12.5 MG PO TABS
12.5000 mg | ORAL_TABLET | Freq: Every day | ORAL | Status: DC
  Administered 2023-04-05: 12.5 mg via ORAL
  Filled 2023-04-04: qty 1

## 2023-04-04 MED ORDER — PROPOFOL 1000 MG/100ML IV EMUL
INTRAVENOUS | Status: AC
  Filled 2023-04-04: qty 100

## 2023-04-04 MED ORDER — SERTRALINE HCL 50 MG PO TABS
150.0000 mg | ORAL_TABLET | Freq: Every day | ORAL | Status: DC
  Administered 2023-04-04 – 2023-04-05 (×2): 150 mg via ORAL
  Filled 2023-04-04 (×2): qty 1

## 2023-04-04 MED ORDER — ROSUVASTATIN CALCIUM 20 MG PO TABS
20.0000 mg | ORAL_TABLET | Freq: Every day | ORAL | Status: DC
  Administered 2023-04-04 – 2023-04-05 (×2): 20 mg via ORAL
  Filled 2023-04-04 (×2): qty 1

## 2023-04-04 MED ORDER — MENTHOL 3 MG MT LOZG: 1.0000 | LOZENGE | OROMUCOSAL | Status: DC | PRN

## 2023-04-04 MED ORDER — OXYCODONE HCL 5 MG/5ML PO SOLN: 5.0000 mg | Freq: Once | ORAL | Status: DC | PRN

## 2023-04-04 MED ORDER — PROPOFOL 10 MG/ML IV BOLUS
INTRAVENOUS | Status: AC
  Filled 2023-04-04: qty 20

## 2023-04-04 MED ORDER — PHENYLEPHRINE HCL-NACL 20-0.9 MG/250ML-% IV SOLN
INTRAVENOUS | Status: DC | PRN
  Administered 2023-04-04: 25 ug/min via INTRAVENOUS

## 2023-04-04 MED ORDER — HYDROMORPHONE HCL 1 MG/ML IJ SOLN: 0.5000 mg | INTRAMUSCULAR | Status: DC | PRN

## 2023-04-04 MED ORDER — OLMESARTAN MEDOXOMIL-HCTZ 20-12.5 MG PO TABS: 1.0000 | ORAL_TABLET | ORAL | Status: DC

## 2023-04-04 MED ORDER — PROPOFOL 10 MG/ML IV BOLUS
INTRAVENOUS | Status: DC | PRN
  Administered 2023-04-04 (×3): 20 mg via INTRAVENOUS

## 2023-04-04 MED ORDER — FENTANYL CITRATE (PF) 100 MCG/2ML IJ SOLN
INTRAMUSCULAR | Status: DC | PRN
  Administered 2023-04-04 (×2): 50 ug via INTRAVENOUS

## 2023-04-04 MED ORDER — GLYCOPYRROLATE 0.2 MG/ML IJ SOLN
INTRAMUSCULAR | Status: DC | PRN
  Administered 2023-04-04: .2 mg via INTRAVENOUS

## 2023-04-04 MED ORDER — 0.9 % SODIUM CHLORIDE (POUR BTL) OPTIME
TOPICAL | Status: DC | PRN
  Administered 2023-04-04: 1000 mL

## 2023-04-04 MED ORDER — METOPROLOL SUCCINATE ER 100 MG PO TB24
100.0000 mg | ORAL_TABLET | Freq: Every day | ORAL | Status: DC
  Administered 2023-04-04 – 2023-04-05 (×2): 100 mg via ORAL
  Filled 2023-04-04 (×2): qty 1

## 2023-04-04 MED ORDER — PHENYLEPHRINE HCL-NACL 20-0.9 MG/250ML-% IV SOLN
INTRAVENOUS | Status: AC
  Filled 2023-04-04: qty 250

## 2023-04-04 MED ORDER — ASPIRIN 81 MG PO CHEW
81.0000 mg | CHEWABLE_TABLET | Freq: Two times a day (BID) | ORAL | Status: DC
  Administered 2023-04-04 – 2023-04-05 (×2): 81 mg via ORAL
  Filled 2023-04-04 (×2): qty 1

## 2023-04-04 MED ORDER — POVIDONE-IODINE 10 % EX SWAB: 2.0000 | Freq: Once | CUTANEOUS | Status: DC

## 2023-04-04 MED ORDER — PHENOL 1.4 % MT LIQD: 1.0000 | OROMUCOSAL | Status: DC | PRN

## 2023-04-04 MED ORDER — PHENYLEPHRINE HCL (PRESSORS) 10 MG/ML IV SOLN
INTRAVENOUS | Status: DC | PRN
  Administered 2023-04-04: 80 ug via INTRAVENOUS

## 2023-04-04 MED ORDER — DEXAMETHASONE SODIUM PHOSPHATE 10 MG/ML IJ SOLN
INTRAMUSCULAR | Status: AC
  Filled 2023-04-04: qty 1

## 2023-04-04 MED ORDER — METFORMIN HCL ER 500 MG PO TB24
500.0000 mg | ORAL_TABLET | Freq: Every day | ORAL | Status: DC
  Administered 2023-04-04: 500 mg via ORAL
  Filled 2023-04-04: qty 1

## 2023-04-04 MED ORDER — CEFAZOLIN SODIUM-DEXTROSE 2-4 GM/100ML-% IV SOLN
2.0000 g | INTRAVENOUS | Status: AC
  Administered 2023-04-04: 2 g via INTRAVENOUS
  Filled 2023-04-04: qty 100

## 2023-04-04 MED ORDER — ONDANSETRON HCL 4 MG/2ML IJ SOLN
INTRAMUSCULAR | Status: AC
  Filled 2023-04-04: qty 2

## 2023-04-04 MED ORDER — AMLODIPINE BESYLATE 5 MG PO TABS
5.0000 mg | ORAL_TABLET | Freq: Every day | ORAL | Status: DC
  Administered 2023-04-05: 5 mg via ORAL
  Filled 2023-04-04: qty 1

## 2023-04-04 MED ORDER — METHOCARBAMOL 500 MG IVPB - SIMPLE MED: 500.0000 mg | Freq: Four times a day (QID) | INTRAVENOUS | Status: DC | PRN

## 2023-04-04 MED ORDER — FENTANYL CITRATE (PF) 100 MCG/2ML IJ SOLN
INTRAMUSCULAR | Status: AC
  Filled 2023-04-04: qty 2

## 2023-04-04 MED ORDER — ONDANSETRON HCL 4 MG/2ML IJ SOLN: 4.0000 mg | Freq: Once | INTRAMUSCULAR | Status: DC | PRN

## 2023-04-04 MED ORDER — MIDAZOLAM HCL 2 MG/2ML IJ SOLN
INTRAMUSCULAR | Status: AC
  Filled 2023-04-04: qty 2

## 2023-04-04 MED ORDER — ONDANSETRON HCL 4 MG/2ML IJ SOLN
INTRAMUSCULAR | Status: DC | PRN
  Administered 2023-04-04: 4 mg via INTRAVENOUS

## 2023-04-04 MED ORDER — LACTATED RINGERS IV SOLN: INTRAVENOUS | Status: DC

## 2023-04-04 MED ORDER — OXYCODONE HCL 5 MG PO TABS
5.0000 mg | ORAL_TABLET | ORAL | Status: DC | PRN
  Administered 2023-04-04 (×2): 5 mg via ORAL
  Administered 2023-04-04 – 2023-04-05 (×2): 10 mg via ORAL
  Filled 2023-04-04: qty 1
  Filled 2023-04-04: qty 2
  Filled 2023-04-04: qty 1
  Filled 2023-04-04: qty 2

## 2023-04-04 MED ORDER — CHLORHEXIDINE GLUCONATE 0.12 % MT SOLN: 15.0000 mL | Freq: Once | OROMUCOSAL | Status: DC

## 2023-04-04 MED ORDER — PANTOPRAZOLE SODIUM 40 MG PO TBEC
40.0000 mg | DELAYED_RELEASE_TABLET | Freq: Every day | ORAL | Status: DC
  Administered 2023-04-04 – 2023-04-05 (×2): 40 mg via ORAL
  Filled 2023-04-04 (×2): qty 1

## 2023-04-04 MED ORDER — ORAL CARE MOUTH RINSE: 15.0000 mL | Freq: Once | OROMUCOSAL | Status: DC

## 2023-04-04 MED ORDER — STERILE WATER FOR IRRIGATION IR SOLN
Status: DC | PRN
  Administered 2023-04-04: 2000 mL

## 2023-04-04 MED ORDER — TRANEXAMIC ACID-NACL 1000-0.7 MG/100ML-% IV SOLN
1000.0000 mg | INTRAVENOUS | Status: AC
  Administered 2023-04-04: 1000 mg via INTRAVENOUS
  Filled 2023-04-04: qty 100

## 2023-04-04 MED ORDER — SODIUM CHLORIDE 0.9 % IR SOLN
Status: DC | PRN
  Administered 2023-04-04: 1000 mL

## 2023-04-04 MED ORDER — HYDROMORPHONE HCL 1 MG/ML IJ SOLN: 0.2500 mg | INTRAMUSCULAR | Status: DC | PRN

## 2023-04-04 MED ORDER — ONDANSETRON HCL 4 MG PO TABS: 4.0000 mg | ORAL_TABLET | Freq: Four times a day (QID) | ORAL | Status: DC | PRN

## 2023-04-04 MED ORDER — CEFAZOLIN SODIUM-DEXTROSE 1-4 GM/50ML-% IV SOLN
1.0000 g | Freq: Four times a day (QID) | INTRAVENOUS | Status: AC
  Administered 2023-04-04 (×2): 1 g via INTRAVENOUS
  Filled 2023-04-04 (×2): qty 50

## 2023-04-04 MED ORDER — METHOCARBAMOL 500 MG PO TABS
500.0000 mg | ORAL_TABLET | Freq: Four times a day (QID) | ORAL | Status: DC | PRN
  Administered 2023-04-04 – 2023-04-05 (×2): 500 mg via ORAL
  Filled 2023-04-04 (×2): qty 1

## 2023-04-04 MED ORDER — LACTATED RINGERS IV SOLN: INTRAVENOUS | Status: DC | PRN

## 2023-04-04 MED ORDER — SODIUM CHLORIDE 0.9 % IV SOLN: INTRAVENOUS | Status: DC

## 2023-04-04 MED ORDER — METOCLOPRAMIDE HCL 5 MG/ML IJ SOLN: 5.0000 mg | Freq: Three times a day (TID) | INTRAMUSCULAR | Status: DC | PRN

## 2023-04-04 MED ORDER — DOCUSATE SODIUM 100 MG PO CAPS
100.0000 mg | ORAL_CAPSULE | Freq: Two times a day (BID) | ORAL | Status: DC
  Administered 2023-04-04 – 2023-04-05 (×3): 100 mg via ORAL
  Filled 2023-04-04 (×3): qty 1

## 2023-04-04 MED ORDER — NICOTINE 7 MG/24HR TD PT24
7.0000 mg | MEDICATED_PATCH | Freq: Every day | TRANSDERMAL | Status: DC
  Administered 2023-04-04 – 2023-04-05 (×2): 7 mg via TRANSDERMAL
  Filled 2023-04-04 (×2): qty 1

## 2023-04-04 MED ORDER — OXYCODONE HCL 5 MG PO TABS: 5.0000 mg | ORAL_TABLET | Freq: Once | ORAL | Status: DC | PRN

## 2023-04-04 MED ORDER — MIDAZOLAM HCL 5 MG/5ML IJ SOLN
INTRAMUSCULAR | Status: DC | PRN
  Administered 2023-04-04: 2 mg via INTRAVENOUS

## 2023-04-04 MED ORDER — DIPHENHYDRAMINE HCL 12.5 MG/5ML PO ELIX: 12.5000 mg | ORAL_SOLUTION | ORAL | Status: DC | PRN

## 2023-04-04 SURGICAL SUPPLY — 42 items
APL SKNCLS NONHYPOALLERGENIC (GAUZE/BANDAGES/DRESSINGS)
APL SKNCLS STERI-STRIP NONHPOA (GAUZE/BANDAGES/DRESSINGS)
BAG COUNTER SPONGE SURGICOUNT (BAG) ×1 IMPLANT
BAG SPEC THK2 15X12 ZIP CLS (MISCELLANEOUS)
BAG SPNG CNTER NS LX DISP (BAG) ×1
BAG ZIPLOCK 12X15 (MISCELLANEOUS) IMPLANT
BENZOIN TINCTURE PRP APPL 2/3 (GAUZE/BANDAGES/DRESSINGS) IMPLANT
BLADE SAW SGTL 18X1.27X75 (BLADE) ×1 IMPLANT
COVER PERINEAL POST (MISCELLANEOUS) ×1 IMPLANT
COVER SURGICAL LIGHT HANDLE (MISCELLANEOUS) ×1 IMPLANT
DRAPE FOOT SWITCH (DRAPES) ×1 IMPLANT
DRAPE STERI IOBAN 125X83 (DRAPES) ×1 IMPLANT
DRAPE U-SHAPE 47X51 STRL (DRAPES) ×2 IMPLANT
DRSG AQUACEL AG ADV 3.5X10 (GAUZE/BANDAGES/DRESSINGS) ×1 IMPLANT
DURAPREP 26ML APPLICATOR (WOUND CARE) ×1 IMPLANT
ELECT REM PT RETURN 15FT ADLT (MISCELLANEOUS) ×1 IMPLANT
GAUZE XEROFORM 1X8 LF (GAUZE/BANDAGES/DRESSINGS) IMPLANT
GLOVE BIO SURGEON STRL SZ7.5 (GLOVE) ×1 IMPLANT
GLOVE BIOGEL PI IND STRL 8 (GLOVE) ×2 IMPLANT
GLOVE ECLIPSE 8.0 STRL XLNG CF (GLOVE) ×1 IMPLANT
GOWN STRL REUS W/ TWL XL LVL3 (GOWN DISPOSABLE) ×2 IMPLANT
GOWN STRL REUS W/TWL XL LVL3 (GOWN DISPOSABLE) ×2
HANDPIECE INTERPULSE COAX TIP (DISPOSABLE) ×1
HEAD CERAMIC 36 PLUS5 (Hips) IMPLANT
HOLDER FOLEY CATH W/STRAP (MISCELLANEOUS) ×1 IMPLANT
KIT TURNOVER KIT A (KITS) IMPLANT
LINER ACETAB NEUTRAL 36ID 520D (Liner) IMPLANT
PACK ANTERIOR HIP CUSTOM (KITS) ×1 IMPLANT
PIN SECTOR W/GRIP ACE CUP 52MM (Hips) IMPLANT
SET HNDPC FAN SPRY TIP SCT (DISPOSABLE) ×1 IMPLANT
STAPLER VISISTAT 35W (STAPLE) IMPLANT
STEM FEMORAL SZ6 HIGH ACTIS (Stem) IMPLANT
STRIP CLOSURE SKIN 1/2X4 (GAUZE/BANDAGES/DRESSINGS) IMPLANT
SUT ETHIBOND NAB CT1 #1 30IN (SUTURE) ×1 IMPLANT
SUT ETHILON 2 0 PS N (SUTURE) IMPLANT
SUT MNCRL AB 4-0 PS2 18 (SUTURE) IMPLANT
SUT VIC AB 0 CT1 36 (SUTURE) ×1 IMPLANT
SUT VIC AB 1 CT1 36 (SUTURE) ×1 IMPLANT
SUT VIC AB 2-0 CT1 27 (SUTURE) ×2
SUT VIC AB 2-0 CT1 TAPERPNT 27 (SUTURE) ×2 IMPLANT
TRAY FOLEY MTR SLVR 16FR STAT (SET/KITS/TRAYS/PACK) IMPLANT
YANKAUER SUCT BULB TIP NO VENT (SUCTIONS) ×1 IMPLANT

## 2023-04-04 NOTE — Interval H&P Note (Signed)
History and Physical Interval Note: Patient understands that he is here today for a a left total hip replacement to treat his severe left hip arthritis.  There has been no acute or interval change in his medical status.  The risks and benefits of surgery have been discussed in detail and informed consent has been obtained.  The left operative hip has been marked.  04/04/2023 7:02 AM  [name]  has presented today for surgery, with the diagnosis of left hip osteoarthritis.  The various methods of treatment have been discussed with the patient and family. After consideration of risks, benefits and other options for treatment, the patient has consented to  Procedure(s) with comments: LEFT TOTAL HIP ARTHROPLASTY ANTERIOR APPROACH (Left) - RNFA as a surgical intervention.  The patient's history has been reviewed, patient examined, no change in status, stable for surgery.  I have reviewed the patient's chart and labs.  Questions were answered to the patient's satisfaction.     Kathryne Hitch

## 2023-04-04 NOTE — Plan of Care (Signed)
  Problem: Education: Goal: Knowledge of the prescribed therapeutic regimen will improve Outcome: Progressing Goal: Understanding of discharge needs will improve Outcome: Progressing   Problem: Activity: Goal: Ability to avoid complications of mobility impairment will improve Outcome: Progressing Goal: Ability to tolerate increased activity will improve Outcome: Progressing   Problem: Pain Management: Goal: Pain level will decrease with appropriate interventions Outcome: Progressing   

## 2023-04-04 NOTE — Transfer of Care (Signed)
Immediate Anesthesia Transfer of Care Note  Patient: [name]  Procedure(s) Performed: LEFT TOTAL HIP ARTHROPLASTY ANTERIOR APPROACH (Left: Hip)  Patient Location: PACU  Anesthesia Type:MAC combined with regional for post-op pain  Level of Consciousness: drowsy and patient cooperative  Airway & Oxygen Therapy: Patient Spontanous Breathing and Patient connected to nasal cannula oxygen  Post-op Assessment: Report given to RN and Post -op Vital signs reviewed and stable  Post vital signs: Reviewed and stable  Last Vitals:  Vitals Value Taken Time  BP 91/58 04/04/23 1010  Temp    Pulse 62 04/04/23 1011  Resp 13 04/04/23 1011  SpO2 100 % 04/04/23 1011  Vitals shown include unvalidated device data.  Last Pain:  Vitals:   04/04/23 0612  TempSrc: Oral         Complications: No notable events documented.

## 2023-04-04 NOTE — Progress Notes (Signed)
Patient reported scratch on left side above hip area.  Been there several weeks from a puppy scratch.  Area is clean scabbed over.  No redness at area.  Most of it has already healed.  Reported to Dr Magnus Ivan,

## 2023-04-04 NOTE — Evaluation (Signed)
Physical Therapy Evaluation Patient Details Name: [name] MRN: [id number] DOB: 1966-03-20 Today's Date: 04/04/2023  History of Present Illness  57 yo male presents to therapy s/p L THA, anterior approach on 04/04/2023 due to failure of conservative measures. Pt PMH includes but is not limited to: DM II, heart murmur, HDL, HTN, B knee sx, L proximal tibial fx s/p ORIF.  Clinical Impression    [name] is a 57 y.o. male POD 0 s/p L THA. Patient reports IND with mobility at baseline. Patient is now limited by functional impairments (see PT problem list below) and requires S for bed mobility and min guard and cues for transfers. Patient was able to ambulate 65 feet with RW and min guard progressing quickly to S level of assist. Patient instructed in exercise to facilitate ROM and circulation to manage edema. Patient will benefit from continued skilled PT interventions to address impairments and progress towards PLOF. Acute PT will follow to progress mobility and stair training in preparation for safe discharge home with family support and [facility] services.      Recommendations for follow up therapy are one component of a multi-disciplinary discharge planning process, led by the attending physician.  Recommendations may be updated based on patient status, additional functional criteria and insurance authorization.  Follow Up Recommendations       Assistance Recommended at Discharge Intermittent Supervision/Assistance  Patient can return home with the following  A little help with walking and/or transfers;A little help with bathing/dressing/bathroom;Assistance with cooking/housework;Assist for transportation;Help with stairs or ramp for entrance    Equipment Recommendations Rolling walker (2 wheels)  Recommendations for Other Services       Functional Status Assessment Patient has had a recent decline in their functional status and demonstrates the ability to make significant improvements in  function in a reasonable and predictable amount of time.     Precautions / Restrictions Precautions Precautions: Fall Restrictions Weight Bearing Restrictions: No      Mobility  Bed Mobility Overal bed mobility: Needs Assistance Bed Mobility: Supine to Sit     Supine to sit: Supervision     General bed mobility comments: min cues HOB elevated    Transfers Overall transfer level: Needs assistance Equipment used: Rolling walker (2 wheels) Transfers: Sit to/from Stand Sit to Stand: Min guard           General transfer comment: cues for proper UE placement, safety with Rw placement when transitioning to recliner    Ambulation/Gait Ambulation/Gait assistance: Supervision Gait Distance (Feet): 65 Feet Assistive device: Rolling walker (2 wheels) Gait Pattern/deviations: Step-to pattern, Antalgic Gait velocity: decreased        Stairs            Wheelchair Mobility    Modified Rankin (Stroke Patients Only)       Balance Overall balance assessment: Needs assistance Sitting-balance support: Feet supported Sitting balance-Leahy Scale: Good     Standing balance support: Bilateral upper extremity supported, During functional activity, Reliant on assistive device for balance Standing balance-Leahy Scale: Fair Standing balance comment: static standing No UE support                             Pertinent Vitals/Pain Pain Assessment Pain Assessment: 0-10 Pain Score: 4  Pain Location: L knee and hip Pain Descriptors / Indicators: Aching, Constant, Discomfort, Operative site guarding Pain Intervention(s): Limited activity within patient's tolerance, Monitored during session, Premedicated before session, Repositioned,  Ice applied (pain increased from 2/10 at rest)    Home Living Family/patient expects to be discharged to:: Private residence Living Arrangements: Spouse/significant other Available Help at Discharge: Family Type of Home:  House Home Access: Stairs to enter Entrance Stairs-Rails: None Entrance Stairs-Number of Steps: 3 (non sequential can use RW)   Home Layout: One level Home Equipment: None      Prior Function Prior Level of Function : Independent/Modified Independent             Mobility Comments: IND with all ADLs, IADLs, driving, working       Hand Dominance        Extremity/Trunk Assessment        Lower Extremity Assessment Lower Extremity Assessment: LLE deficits/detail LLE Deficits / Details: ankle DF/PF 5/5 LLE Sensation: WNL    Cervical / Trunk Assessment Cervical / Trunk Assessment:  (wfl)  Communication   Communication: No difficulties  Cognition Arousal/Alertness: Awake/alert Behavior During Therapy: WFL for tasks assessed/performed Overall Cognitive Status: Within Functional Limits for tasks assessed                                          General Comments      Exercises Total Joint Exercises Ankle Circles/Pumps: AROM, Both, 20 reps   Assessment/Plan    PT Assessment Patient needs continued PT services  PT Problem List Decreased strength;Decreased range of motion;Decreased activity tolerance;Decreased balance;Decreased mobility;Pain       PT Treatment Interventions DME instruction;Gait training;Stair training;Functional mobility training;Therapeutic activities;Therapeutic exercise;Balance training;Neuromuscular re-education;Patient/family education;Modalities    PT Goals (Current goals can be found in the Care Plan section)  Acute Rehab PT Goals Patient Stated Goal: to be able to play golf, get in and out of the boat, hopefull return to pickleball and reduce the stress and pain on L knee PT Goal Formulation: With patient Time For Goal Achievement: 04/18/23 Potential to Achieve Goals: Good    Frequency 7X/week     Co-evaluation               AM-PAC PT "6 Clicks" Mobility  Outcome Measure Help needed turning from your back to  your side while in a flat bed without using bedrails?: None Help needed moving from lying on your back to sitting on the side of a flat bed without using bedrails?: None Help needed moving to and from a bed to a chair (including a wheelchair)?: A Little Help needed standing up from a chair using your arms (e.g., wheelchair or bedside chair)?: A Little Help needed to walk in hospital room?: A Little Help needed climbing 3-5 steps with a railing? : A Lot 6 Click Score: 19    End of Session Equipment Utilized During Treatment: Gait belt Activity Tolerance: Patient tolerated treatment well Patient left: in chair;with call bell/phone within reach;with chair alarm set Nurse Communication: Mobility status PT Visit Diagnosis: Unsteadiness on feet (R26.81);Other abnormalities of gait and mobility (R26.89);Muscle weakness (generalized) (M62.81);Pain;Difficulty in walking, not elsewhere classified (R26.2) Pain - Right/Left: Left Pain - part of body: Hip;Knee    Time: 1610-9604 PT Time Calculation (min) (ACUTE ONLY): 31 min   Charges:   PT Evaluation $PT Eval Low Complexity: 1 Low PT Treatments $Gait Training: 8-22 mins        Johnny Bridge, PT Acute Rehab   Jacqualyn Posey 04/04/2023, 4:16 PM

## 2023-04-04 NOTE — Anesthesia Postprocedure Evaluation (Signed)
Anesthesia Post Note  Patient: [name]  Procedure(s) Performed: LEFT TOTAL HIP ARTHROPLASTY ANTERIOR APPROACH (Left: Hip)     Patient location during evaluation: PACU Anesthesia Type: Spinal Level of consciousness: oriented and awake and alert Pain management: pain level controlled Vital Signs Assessment: post-procedure vital signs reviewed and stable Respiratory status: spontaneous breathing, respiratory function stable and patient connected to nasal cannula oxygen Cardiovascular status: blood pressure returned to baseline and stable Postop Assessment: no headache, no backache and no apparent nausea or vomiting Anesthetic complications: no  No notable events documented.  Last Vitals:  Vitals:   04/04/23 1200 04/04/23 1215  BP: 119/81 122/89  Pulse: 60 68  Resp: 17 14  Temp: 36.7 C   SpO2: 100% 99%    Last Pain:  Vitals:   04/04/23 1215  TempSrc:   PainSc: 0-No pain                 Tonie Vizcarrondo S

## 2023-04-04 NOTE — Op Note (Signed)
Operative Note  Date of operation: 04/04/2023  Preoperative diagnosis: Left hip primary osteoarthritis Postoperative diagnosis: Same  Procedure: Left direct anterior total hip arthroplasty  Implants: Implant Name Type Inv. Item Serial No. Manufacturer Lot No. LRB No. Used Action  STEM FEMORAL SZ6 HIGH ACTIS - [id number] Stem STEM FEMORAL SZ6 HIGH ACTIS  [employer] [id number] Left 1 Implanted  LINER ACETAB NEUTRAL 36ID 520D - [id number] Liner LINER ACETAB NEUTRAL 36ID 520D  DEPU[employer]TH[employer] [id number] Left 1 Implanted  HEAD CERAMIC 36 PLUS5 - [id number] Hips HEAD CERAMIC 36 PLUS5  DEPU[employer]TH[employer] [id number] Left 1 Implanted  PIN SECTOR W/GRIP ACE CUP - [id number] Hips PIN SECTOR W/GRIP ACE CUP  [employer]PU[employer] [id number] Left 1 Implanted   Surgeon: Vanita Panda. Magnus Ivan, MD Assistant: Tomma Lightning, RNFA  Anesthesia: Spinal Antibiotics: IV Ancef EBL: 100 cc Complications: None  Indications: The patient is a 57 year old gentleman with well-documented severe arthritis of his left hip.  He actually has arthritis in his right hip and his knees.  His arthritis is detrimentally affecting his mobility, his his activities of daily living and his quality of life to the point he wishes to proceed with a hip replacement.  He has tried and failed all forms conservative treatment.  We talked in length in detail about the risks of acute blood loss anemia, nerve vessel injury, fracture, infection, DVT, implant failure, dislocation, leg length differences and wound healing issues.  He understands her goals are hopefully decrease pain, improve mobility, and improve quality of life.  Procedure description: After informed consent was obtained and the appropriate left hip was marked, the patient was brought to the operating room and sat up on the stretcher where spinal anesthesia was obtained.  He was then laid in supine position on stretcher and a Foley catheter was placed.  Traction boots  were placed on both his feet and he was placed supine on the Hana fracture table with a perineal post in place in both legs and inline skeletal traction devices but no traction applied.  His left operative hip and pelvis were assessed radiographically.  A timeout was called he was identified as a correct patient correct left hip after prepping and draping with DuraPrep and sterile drapes.  An incision was then made just inferior to the ASIS and carried slightly obliquely down the leg.  It was also slightly posterior to the ASIS.  Dissection was carried down to the tensor fascia lata muscle and the tensor fascia was then divided longitudinally to proceed with a very tender approach to the hip.  Circumflex vessels were identified and cauterized.  The hip capsule was identified and opened up in L-type format finding a moderate joint effusion.  Cobra retractors were placed around the medial and lateral femoral neck and a femoral neck cut was made with an oscillating saw just proximal to the lesser trochanter.  This was completed with an osteotome.  A corkscrew guide was placed in the femoral head and the femoral head was removed in its entirety wide area found devoid of cartilage.  A bent Hohmann was then placed over the medial acetabular rim and remnants of the acetabular labrum and other debris removed.  We then began reaming under direct visualization from a size 43 reamer and stepwise increments going up to a size 51 reamer with all reamers placed under direct visualization and the last reamer placed under direct fluoroscopy as well in order to obtain the depth of reaming, the inclination and anteversion.  The real DePuy sector GRIPTION Astra implant size 52 was then placed without difficulty followed by a 36+0 polythene liner.  Attention was then turned to the femur.  With the leg externally rotated to 120 degrees, extended and adducted, a Mueller retractor was placed medially and a Hohmann retractor was placed  behind the greater trochanter.  The lateral joint capsule was released and a box cutting osteotome was used into the femoral canal.  Broaching was then initiated using the Actis broaching system from a size 0 going up to a size 6.  With a size 6 in place we trialed a standard offset femoral neck and a 36+1.5 trial hip ball.  The leg was brought over and up and with traction and internal rotation reduced in the pelvis.  We assessed it radiographically and clinically and we needed more offset and leg length.  We dislocated the hip and remove the trial components.  We then placed the real Actis femoral component size 6 but 1 with high offset.  We went with a 36+5 ceramic head ball.  Again this was reduced in the pelvis and we are pleased with leg length, offset, range of motion and stability.  Of note he is actually longer on his left leg preop compared to the right leg due to arthritis in both hips.  He is still going to be longer on the side.  The soft tissue was then irrigated normal saline solution.  The joint capsule was closed with interrupted #1 Ethibond suture followed by #1 Vicryl close the tensor fascia.  0 Vicryl was used to close deep tissue and 2-0 Vicryl was used to close the subcutaneous tissue.  The skin was closed with staples.  An Aquacel dressing was applied.  The patient was taken off of the Hana table and taken to the recovery room in stable condition.

## 2023-04-04 NOTE — Anesthesia Procedure Notes (Signed)
Spinal  Patient location during procedure: OR Start time: 04/04/2023 8:34 AM End time: 04/04/2023 8:38 AM Reason for block: surgical anesthesia Staffing Performed: anesthesiologist  Anesthesiologist: Eilene Ghazi, MD Performed by: Eilene Ghazi, MD Authorized by: Eilene Ghazi, MD   Preanesthetic Checklist Completed: patient identified, IV checked, site marked, risks and benefits discussed, surgical consent, monitors and equipment checked, pre-op evaluation and timeout performed Spinal Block Patient position: sitting Prep: Betadine Patient monitoring: heart rate, continuous pulse ox and blood pressure Approach: midline Location: L4-5 Injection technique: single-shot Needle Needle type: Sprotte  Needle gauge: 24 G Needle length: 9 cm Assessment Sensory level: T6 Events: CSF return Additional Notes

## 2023-04-05 DIAGNOSIS — I10 Essential (primary) hypertension: Secondary | ICD-10-CM | POA: Diagnosis not present

## 2023-04-05 DIAGNOSIS — Z7984 Long term (current) use of oral hypoglycemic drugs: Secondary | ICD-10-CM | POA: Diagnosis not present

## 2023-04-05 DIAGNOSIS — Z96642 Presence of left artificial hip joint: Secondary | ICD-10-CM | POA: Diagnosis not present

## 2023-04-05 DIAGNOSIS — Z79899 Other long term (current) drug therapy: Secondary | ICD-10-CM | POA: Diagnosis not present

## 2023-04-05 DIAGNOSIS — E119 Type 2 diabetes mellitus without complications: Secondary | ICD-10-CM | POA: Diagnosis not present

## 2023-04-05 DIAGNOSIS — M1612 Unilateral primary osteoarthritis, left hip: Secondary | ICD-10-CM | POA: Diagnosis not present

## 2023-04-05 DIAGNOSIS — F17210 Nicotine dependence, cigarettes, uncomplicated: Secondary | ICD-10-CM | POA: Diagnosis not present

## 2023-04-05 DIAGNOSIS — M25561 Pain in right knee: Secondary | ICD-10-CM | POA: Diagnosis not present

## 2023-04-05 LAB — CBC
HCT: 40.2 % (ref 39.0–52.0)
Hemoglobin: 13.7 g/dL (ref 13.0–17.0)
MCH: 36.1 pg — ABNORMAL HIGH (ref 26.0–34.0)
MCHC: 34.1 g/dL (ref 30.0–36.0)
MCV: 106.1 fL — ABNORMAL HIGH (ref 80.0–100.0)
Platelets: 133 10*3/uL — ABNORMAL LOW (ref 150–400)
RBC: 3.79 MIL/uL — ABNORMAL LOW (ref 4.22–5.81)
RDW: 12 % (ref 11.5–15.5)
WBC: 11.3 10*3/uL — ABNORMAL HIGH (ref 4.0–10.5)
nRBC: 0 % (ref 0.0–0.2)

## 2023-04-05 LAB — BASIC METABOLIC PANEL
Anion gap: 4 — ABNORMAL LOW (ref 5–15)
BUN: 19 mg/dL (ref 6–20)
CO2: 25 mmol/L (ref 22–32)
Calcium: 8.7 mg/dL — ABNORMAL LOW (ref 8.9–10.3)
Chloride: 103 mmol/L (ref 98–111)
Creatinine, Ser: 0.91 mg/dL (ref 0.61–1.24)
GFR, Estimated: >60 mL/min (ref >60)
Glucose, Bld: 140 mg/dL — ABNORMAL HIGH (ref 70–99)
Potassium: 4.6 mmol/L (ref 3.5–5.1)
Sodium: 132 mmol/L — ABNORMAL LOW (ref 135–145)

## 2023-04-05 MED ORDER — METHOCARBAMOL 500 MG PO TABS: 500.0000 mg | ORAL_TABLET | Freq: Four times a day (QID) | ORAL | 1 refills | Status: DC | PRN

## 2023-04-05 MED ORDER — OXYCODONE HCL 5 MG PO TABS: 5.0000 mg | ORAL_TABLET | Freq: Four times a day (QID) | ORAL | 0 refills | Status: DC | PRN

## 2023-04-05 MED ORDER — ASPIRIN 81 MG PO CHEW: 81.0000 mg | CHEWABLE_TABLET | Freq: Two times a day (BID) | ORAL | 0 refills | Status: DC

## 2023-04-05 NOTE — TOC Transition Note (Signed)
Transition of Care [facility]) - CM/SW Discharge Note   Patient Details  Name: [name] MRN: [id number] Date of Birth: November 15, 1965  [facility] CM/SW Contact:  Amada Jupiter, LCSW Phone Number: 04/05/2023, 9:05 AM   Clinical Narrative:     Met with pt who confirms need for RW and no DME agency preference - order placed with [employer] for delivery to room today.  HHPT prearranged with [facility] - info on AVS.  No further TOC needs.   Final next level of care: Home w Home Health Services Barriers to Discharge: No Barriers Identified   Patient Goals and CMS Choice      Discharge Placement                         Discharge Plan and Services Additional resources added to the After Visit Summary for                  DME Arranged: Walker rolling DME Agency: [employer] Date DME Agency Contacted: 04/05/23 Time DME Agency Contacted: [phone] Representative spoke with at DME Agency: Leavy Cella HH Arranged: PT HH Agency: [facility]        Social Determinants of Health (SDOH) Interventions SDOH Screenings   Food Insecurity: No Food Insecurity (04/04/2023)  Housing: Low Risk  (04/04/2023)  Transportation Needs: No Transportation Needs (04/04/2023)  Utilities: Not At Risk (04/04/2023)  Tobacco Use: High Risk (04/04/2023)     Readmission Risk Interventions     No data to display

## 2023-04-05 NOTE — Plan of Care (Signed)

## 2023-04-05 NOTE — Progress Notes (Signed)
Subjective: 1 Day Post-Op Procedure(s) (LRB): LEFT TOTAL HIP ARTHROPLASTY ANTERIOR APPROACH (Left) Patient reports pain as moderate.    Objective: Vital signs in last 24 hours: Temp:  [97.7 F (36.5 C)-98.6 F (37 C)] 98.1 F (36.7 C) (06/22 0950) Pulse Rate:  [48-93] 69 (06/22 0950) Resp:  [12-18] 18 (06/22 0950) BP: (108-132)/(68-89) 110/80 (06/22 0950) SpO2:  [92 %-100 %] 97 % (06/22 0950) FiO2 (%):  [21 %] 21 % (06/21 2055)  Intake/Output from previous day: 06/21 0701 - 06/22 0700 In: 2967.2 [P.O.:1010; I.V.:1228.5; IV Piggyback:278.7] Out: 900 [Urine:800; Blood:100] Intake/Output this shift: Total I/O In: 240 [P.O.:240] Out: -   Recent Labs    04/05/23 0336  HGB 13.7   Recent Labs    04/05/23 0336  WBC 11.3*  RBC 3.79*  HCT 40.2  PLT 133*   Recent Labs    04/05/23 0336  NA 132*  K 4.6  CL 103  CO2 25  BUN 19  CREATININE 0.91  GLUCOSE 140*  CALCIUM 8.7*   No results for input(s): "LABPT", "INR" in the last 72 hours.  Sensation intact distally Intact pulses distally Dorsiflexion/Plantar flexion intact Incision: dressing C/D/I   Assessment/Plan: 1 Day Post-Op Procedure(s) (LRB): LEFT TOTAL HIP ARTHROPLASTY ANTERIOR APPROACH (Left) Up with therapy Discharge home with home health      Kathryne Hitch 04/05/2023, 11:09 AM

## 2023-04-05 NOTE — Progress Notes (Signed)
Physical Therapy Treatment Patient Details Name: [name] MRN: [id number] DOB: 11-21-1965 Today's Date: 04/05/2023   History of Present Illness 57 yo male presents to therapy s/p L THA, anterior approach on 04/04/2023 due to failure of conservative measures. Pt PMH includes but is not limited to: DM II, heart murmur, HDL, HTN, B knee sx, L proximal tibial fx s/p ORIF.    PT Comments    Pt very motivated and progressing well with mobility.  HEP initiated and pt up to ambulate increased distance in hall, negotiated stairs, and reviewed  car transfers.  Pt eager for dc home this date.   Recommendations for follow up therapy are one component of a multi-disciplinary discharge planning process, led by the attending physician.  Recommendations may be updated based on patient status, additional functional criteria and insurance authorization.  Follow Up Recommendations       Assistance Recommended at Discharge Intermittent Supervision/Assistance  Patient can return home with the following A little help with walking and/or transfers;A little help with bathing/dressing/bathroom;Assistance with cooking/housework;Assist for transportation;Help with stairs or ramp for entrance   Equipment Recommendations  Rolling walker (2 wheels)    Recommendations for Other Services       Precautions / Restrictions Precautions Precautions: Fall Restrictions Weight Bearing Restrictions: No     Mobility  Bed Mobility Overal bed mobility: Needs Assistance Bed Mobility: Supine to Sit     Supine to sit: Supervision     General bed mobility comments: min cues HOB elevated    Transfers Overall transfer level: Needs assistance Equipment used: Rolling walker (2 wheels) Transfers: Sit to/from Stand Sit to Stand: Supervision           General transfer comment: cues for proper UE placement, safety with Rw placement when transitioning to recliner    Ambulation/Gait Ambulation/Gait assistance:  Supervision Gait Distance (Feet): 120 Feet Assistive device: Rolling walker (2 wheels) Gait Pattern/deviations: Step-to pattern, Step-through pattern, Decreased step length - right, Decreased step length - left, Shuffle Gait velocity: decreased     General Gait Details: min cues for sequence, posture and position from RW   Stairs Stairs: Yes Stairs assistance: Min guard Stair Management: No rails, Step to pattern, Forwards, With walker Number of Stairs: 1 General stair comments: min cues for sequence   Wheelchair Mobility    Modified Rankin (Stroke Patients Only)       Balance Overall balance assessment: Needs assistance Sitting-balance support: Feet supported Sitting balance-Leahy Scale: Good     Standing balance support: Bilateral upper extremity supported, During functional activity, Reliant on assistive device for balance Standing balance-Leahy Scale: Fair Standing balance comment: static standing No UE support                            Cognition Arousal/Alertness: Awake/alert Behavior During Therapy: WFL for tasks assessed/performed Overall Cognitive Status: Within Functional Limits for tasks assessed                                          Exercises Total Joint Exercises Ankle Circles/Pumps: AROM, Both, 20 reps Quad Sets: AROM, Both, 10 reps, Supine Heel Slides: AAROM, Left, 20 reps, Supine Hip ABduction/ADduction: AAROM, Left, 15 reps, Supine    General Comments        Pertinent Vitals/Pain Pain Assessment Pain Assessment: 0-10 Pain Score: 3  Pain Location:  L knee and hip Pain Descriptors / Indicators: Aching, Discomfort, Operative site guarding, Burning Pain Intervention(s): Limited activity within patient's tolerance, Monitored during session, Premedicated before session    Home Living                          Prior Function            PT Goals (current goals can now be found in the care plan  section) Acute Rehab PT Goals Patient Stated Goal: to be able to play golf, get in and out of the boat, hopefull return to pickleball and reduce the stress and pain on L knee PT Goal Formulation: With patient Time For Goal Achievement: 04/18/23 Potential to Achieve Goals: Good Progress towards PT goals: Progressing toward goals    Frequency    7X/week      PT Plan Current plan remains appropriate    Co-evaluation              AM-PAC PT "6 Clicks" Mobility   Outcome Measure  Help needed turning from your back to your side while in a flat bed without using bedrails?: None Help needed moving from lying on your back to sitting on the side of a flat bed without using bedrails?: None Help needed moving to and from a bed to a chair (including a wheelchair)?: A Little Help needed standing up from a chair using your arms (e.g., wheelchair or bedside chair)?: A Little Help needed to walk in hospital room?: A Little Help needed climbing 3-5 steps with a railing? : A Little 6 Click Score: 20    End of Session Equipment Utilized During Treatment: Gait belt Activity Tolerance: Patient tolerated treatment well Patient left: in chair;with call bell/phone within reach;with chair alarm set Nurse Communication: Mobility status PT Visit Diagnosis: Unsteadiness on feet (R26.81);Other abnormalities of gait and mobility (R26.89);Muscle weakness (generalized) (M62.81);Pain;Difficulty in walking, not elsewhere classified (R26.2) Pain - Right/Left: Left Pain - part of body: Hip;Knee     Time: 1610-9604 PT Time Calculation (min) (ACUTE ONLY): 32 min  Charges:  $Gait Training: 8-22 mins $Therapeutic Exercise: 8-22 mins                     Mauro Kaufmann PT Acute Rehabilitation Services Pager [phone] Office [phone]    Willmer Fellers 04/05/2023, 9:35 AM

## 2023-04-05 NOTE — Progress Notes (Signed)
Reviewed written d/c instructions w pt and his wife, all questions answered, they both verbalized understanding. D/C via w/c w all belongings in stable condition.

## 2023-04-05 NOTE — Discharge Instructions (Signed)

## 2023-04-05 NOTE — Discharge Summary (Signed)
Patient ID: [name] MRN: [id number] DOB/AGE: 07-04-1966 57 y.o.  Admit date: 04/04/2023 Discharge date: 04/05/2023  Admission Diagnoses:  Principal Problem:   Unilateral primary osteoarthritis, left hip Active Problems:   Status post total replacement of left hip   Discharge Diagnoses:  Same  Past Medical History:  Diagnosis Date   Anxiety    Arthritis    Depression    Diabetes mellitus without complication (HCC)    type 2   Heart murmur    childhood heart murmur   High cholesterol    History of kidney stones    Hypertension     Surgeries: Procedure(s): LEFT TOTAL HIP ARTHROPLASTY ANTERIOR APPROACH on 04/04/2023   Consultants:   Discharged Condition: Improved  Hospital Course: [name] is an 57 y.o. male who was admitted 04/04/2023 for operative treatment ofUnilateral primary osteoarthritis, left hip. Patient has severe unremitting pain that affects sleep, daily activities, and work/hobbies. After pre-op clearance the patient was taken to the operating room on 04/04/2023 and underwent  Procedure(s): LEFT TOTAL HIP ARTHROPLASTY ANTERIOR APPROACH.    Patient was given perioperative antibiotics:  Anti-infectives (From admission, onward)    Start     Dose/Rate Route Frequency Ordered Stop   04/04/23 1500  ceFAZolin (ANCEF) IVPB 1 g/50 mL premix        1 g 100 mL/hr over 30 Minutes Intravenous Every 6 hours 04/04/23 1346 04/05/23 1118   04/04/23 0645  ceFAZolin (ANCEF) IVPB 2g/100 mL premix        2 g 200 mL/hr over 30 Minutes Intravenous On call to O.R. 04/04/23 0630 04/04/23 0904        Patient was given sequential compression devices, early ambulation, and chemoprophylaxis to prevent DVT.  Patient benefited maximally from hospital stay and there were no complications.    Recent vital signs: Patient Vitals for the past 24 hrs:  BP Temp Temp src Pulse Resp SpO2  04/05/23 0950 110/80 98.1 F (36.7 C) Oral 69 18 97 %  04/05/23 0638 122/88 97.7 F (36.5  C) Oral 63 17 99 %  04/05/23 0202 132/82 98.2 F (36.8 C) Oral 71 18 97 %  04/04/23 2226 125/82 98.3 F (36.8 C) -- 78 18 96 %  04/04/23 1752 122/82 98.2 F (36.8 C) Oral 79 16 100 %  04/04/23 1614 124/88 98.6 F (37 C) Oral 93 18 96 %  04/04/23 1413 120/82 98.4 F (36.9 C) Oral 85 -- 97 %  04/04/23 1352 111/68 -- -- 83 16 98 %  04/04/23 1300 122/86 98.3 F (36.8 C) -- 69 12 98 %     Recent laboratory studies:  Recent Labs    04/05/23 0336  WBC 11.3*  HGB 13.7  HCT 40.2  PLT 133*  NA 132*  K 4.6  CL 103  CO2 25  BUN 19  CREATININE 0.91  GLUCOSE 140*  CALCIUM 8.7*     Discharge Medications:   Allergies as of 04/05/2023   No Known Allergies      Medication List     TAKE these medications    amLODipine 5 MG tablet Commonly known as: NORVASC Take 1 tablet (5 mg total) by mouth every morning.   aspirin 81 MG chewable tablet Chew 1 tablet (81 mg total) by mouth 2 (two) times daily.   BIOFREEZE EX Apply 1 Application topically daily as needed (knee pain).   glucose blood test strip 1 each by Other route as needed for other. Use as instructed  meloxicam 7.5 MG tablet Commonly known as: MOBIC TAKE 1 TABLET(7.5 MG) BY MOUTH TWICE DAILY What changed: See the new instructions.   metFORMIN 500 MG 24 hr tablet Commonly known as: GLUCOPHAGE-XR Take 500 mg by mouth every evening.   methocarbamol 500 MG tablet Commonly known as: ROBAXIN Take 1 tablet (500 mg total) by mouth every 6 (six) hours as needed for muscle spasms.   metoprolol succinate 100 MG 24 hr tablet Commonly known as: TOPROL-XL Take 100 mg by mouth daily.   olmesartan-hydrochlorothiazide 20-12.5 MG tablet Commonly known as: BENICAR HCT Take 1 tablet by mouth every morning.   oxyCODONE 5 MG immediate release tablet Commonly known as: Oxy IR/ROXICODONE Take 1-2 tablets (5-10 mg total) by mouth every 6 (six) hours as needed for moderate pain (pain score 4-6).   rosuvastatin 20 MG  tablet Commonly known as: CRESTOR Take 1 tablet (20 mg total) by mouth daily.   sertraline 100 MG tablet Commonly known as: ZOLOFT Take 150 mg by mouth daily.   sildenafil 50 MG tablet Commonly known as: VIAGRA Take 50 mg by mouth daily as needed for erectile dysfunction. 1-2 daily prn               Durable Medical Equipment  (From admission, onward)           Start     Ordered   04/04/23 1347  DME 3 n 1  Once        04/04/23 1346   04/04/23 1347  DME Walker rolling  Once       Question Answer Comment  Walker: With 5 Inch Wheels   Patient needs a walker to treat with the following condition Status post total replacement of left hip      04/04/23 1346            Diagnostic Studies: DG Pelvis Portable  Result Date: 04/04/2023 CLINICAL DATA:  Status post left hip replacement. EXAM: PORTABLE PELVIS 1-2 VIEWS COMPARISON:  None Available. FINDINGS: Left hip arthroplasty in expected alignment. No periprosthetic lucency or fracture. Recent postsurgical change includes air and edema in the soft tissues. IMPRESSION: Left hip arthroplasty without immediate postoperative complication. Electronically Signed   By: Narda Rutherford M.D.   On: 04/04/2023 11:35   DG HIP UNILAT WITH PELVIS 2-3 VIEWS LEFT  Result Date: 04/04/2023 CLINICAL DATA:  Left hip replacement EXAM: DG HIP (WITH OR WITHOUT PELVIS) 2-3V LEFT COMPARISON:  08/14/2022 FLUOROSCOPY TIME:  Radiation Exposure Index (as provided by the fluoroscopic device): 3.24 mGy If the device does not provide the exposure index: Fluoroscopy Time:  3 seconds Number of Acquired Images:  3 FINDINGS: Left hip replacement is noted in satisfactory position. No acute bony abnormality is noted. IMPRESSION: Status post left hip replacement. Electronically Signed   By: Alcide Clever M.D.   On: 04/04/2023 10:13   DG C-Arm 1-60 Min-No Report  Result Date: 04/04/2023 Fluoroscopy was utilized by the requesting physician.  No radiographic  interpretation.   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 LV is mildly dilated both in rest and stress. TID ratio 0.99, which is normal.  There is mild hypokinesis infero-apical region.  Stress LV EF is mildly dysfunctional 41%. Nondiagnostic ECG  stress. The heart rate response was consistent with Regadenoson.  No previous exam available for comparison. Intermediate risk.    Disposition: Discharge disposition: 01-Home or Self Care          Follow-up Information     [facility] Follow up.   Specialty: Home Health Services Why: to provide home physical therapy visits Contact information: [address] [place] [address] [phone]         Kathryne Hitch, MD Follow up in 2 week(s).   Specialty: Orthopedic Surgery Contact information: [address] [place] [address] [phone]                  Signed: Kathryne Hitch 04/05/2023, 12:57 PM

## 2023-04-05 NOTE — Progress Notes (Signed)
   04/04/23 2055  BiPAP/CPAP/SIPAP  BiPAP/CPAP/SIPAP Pt Type Adult  BiPAP/CPAP/SIPAP Resmed  Mask Type Full face mask  FiO2 (%) 21 %  Patient Home Equipment No  Auto Titrate Yes (5-20 cmH2O)  MEWS Score/Color  MEWS Score 0  MEWS Score Color [place]

## 2023-04-06 DIAGNOSIS — F419 Anxiety disorder, unspecified: Secondary | ICD-10-CM | POA: Diagnosis not present

## 2023-04-06 DIAGNOSIS — F17210 Nicotine dependence, cigarettes, uncomplicated: Secondary | ICD-10-CM | POA: Diagnosis not present

## 2023-04-06 DIAGNOSIS — Z6841 Body Mass Index (BMI) 40.0 and over, adult: Secondary | ICD-10-CM | POA: Diagnosis not present

## 2023-04-06 DIAGNOSIS — F32A Depression, unspecified: Secondary | ICD-10-CM | POA: Diagnosis not present

## 2023-04-06 DIAGNOSIS — Z471 Aftercare following joint replacement surgery: Secondary | ICD-10-CM | POA: Diagnosis not present

## 2023-04-06 DIAGNOSIS — Z7982 Long term (current) use of aspirin: Secondary | ICD-10-CM | POA: Diagnosis not present

## 2023-04-06 DIAGNOSIS — I4510 Unspecified right bundle-branch block: Secondary | ICD-10-CM | POA: Diagnosis not present

## 2023-04-06 DIAGNOSIS — Z8781 Personal history of (healed) traumatic fracture: Secondary | ICD-10-CM | POA: Diagnosis not present

## 2023-04-06 DIAGNOSIS — E119 Type 2 diabetes mellitus without complications: Secondary | ICD-10-CM | POA: Diagnosis not present

## 2023-04-06 DIAGNOSIS — E7800 Pure hypercholesterolemia, unspecified: Secondary | ICD-10-CM | POA: Diagnosis not present

## 2023-04-06 DIAGNOSIS — Z960 Presence of urogenital implants: Secondary | ICD-10-CM | POA: Diagnosis not present

## 2023-04-06 DIAGNOSIS — I10 Essential (primary) hypertension: Secondary | ICD-10-CM | POA: Diagnosis not present

## 2023-04-06 DIAGNOSIS — Z7984 Long term (current) use of oral hypoglycemic drugs: Secondary | ICD-10-CM | POA: Diagnosis not present

## 2023-04-06 DIAGNOSIS — E669 Obesity, unspecified: Secondary | ICD-10-CM | POA: Diagnosis not present

## 2023-04-06 DIAGNOSIS — Z96642 Presence of left artificial hip joint: Secondary | ICD-10-CM | POA: Diagnosis not present

## 2023-04-06 DIAGNOSIS — I444 Left anterior fascicular block: Secondary | ICD-10-CM | POA: Diagnosis not present

## 2023-04-07 ENCOUNTER — Encounter (HOSPITAL_COMMUNITY): Payer: Self-pay | Admitting: Orthopaedic Surgery

## 2023-04-08 DIAGNOSIS — Z8781 Personal history of (healed) traumatic fracture: Secondary | ICD-10-CM | POA: Diagnosis not present

## 2023-04-08 DIAGNOSIS — I4510 Unspecified right bundle-branch block: Secondary | ICD-10-CM | POA: Diagnosis not present

## 2023-04-08 DIAGNOSIS — E7800 Pure hypercholesterolemia, unspecified: Secondary | ICD-10-CM | POA: Diagnosis not present

## 2023-04-08 DIAGNOSIS — I444 Left anterior fascicular block: Secondary | ICD-10-CM | POA: Diagnosis not present

## 2023-04-08 DIAGNOSIS — F32A Depression, unspecified: Secondary | ICD-10-CM | POA: Diagnosis not present

## 2023-04-08 DIAGNOSIS — Z471 Aftercare following joint replacement surgery: Secondary | ICD-10-CM | POA: Diagnosis not present

## 2023-04-08 DIAGNOSIS — F17210 Nicotine dependence, cigarettes, uncomplicated: Secondary | ICD-10-CM | POA: Diagnosis not present

## 2023-04-08 DIAGNOSIS — Z6841 Body Mass Index (BMI) 40.0 and over, adult: Secondary | ICD-10-CM | POA: Diagnosis not present

## 2023-04-08 DIAGNOSIS — Z96642 Presence of left artificial hip joint: Secondary | ICD-10-CM | POA: Diagnosis not present

## 2023-04-08 DIAGNOSIS — Z7982 Long term (current) use of aspirin: Secondary | ICD-10-CM | POA: Diagnosis not present

## 2023-04-08 DIAGNOSIS — Z960 Presence of urogenital implants: Secondary | ICD-10-CM | POA: Diagnosis not present

## 2023-04-08 DIAGNOSIS — E669 Obesity, unspecified: Secondary | ICD-10-CM | POA: Diagnosis not present

## 2023-04-08 DIAGNOSIS — Z7984 Long term (current) use of oral hypoglycemic drugs: Secondary | ICD-10-CM | POA: Diagnosis not present

## 2023-04-08 DIAGNOSIS — I10 Essential (primary) hypertension: Secondary | ICD-10-CM | POA: Diagnosis not present

## 2023-04-08 DIAGNOSIS — F419 Anxiety disorder, unspecified: Secondary | ICD-10-CM | POA: Diagnosis not present

## 2023-04-08 DIAGNOSIS — E119 Type 2 diabetes mellitus without complications: Secondary | ICD-10-CM | POA: Diagnosis not present

## 2023-04-09 DIAGNOSIS — Z960 Presence of urogenital implants: Secondary | ICD-10-CM | POA: Diagnosis not present

## 2023-04-09 DIAGNOSIS — E7800 Pure hypercholesterolemia, unspecified: Secondary | ICD-10-CM | POA: Diagnosis not present

## 2023-04-09 DIAGNOSIS — I4510 Unspecified right bundle-branch block: Secondary | ICD-10-CM | POA: Diagnosis not present

## 2023-04-09 DIAGNOSIS — Z7982 Long term (current) use of aspirin: Secondary | ICD-10-CM | POA: Diagnosis not present

## 2023-04-09 DIAGNOSIS — Z471 Aftercare following joint replacement surgery: Secondary | ICD-10-CM | POA: Diagnosis not present

## 2023-04-09 DIAGNOSIS — I10 Essential (primary) hypertension: Secondary | ICD-10-CM | POA: Diagnosis not present

## 2023-04-09 DIAGNOSIS — Z6841 Body Mass Index (BMI) 40.0 and over, adult: Secondary | ICD-10-CM | POA: Diagnosis not present

## 2023-04-09 DIAGNOSIS — E119 Type 2 diabetes mellitus without complications: Secondary | ICD-10-CM | POA: Diagnosis not present

## 2023-04-09 DIAGNOSIS — Z96642 Presence of left artificial hip joint: Secondary | ICD-10-CM | POA: Diagnosis not present

## 2023-04-09 DIAGNOSIS — Z7984 Long term (current) use of oral hypoglycemic drugs: Secondary | ICD-10-CM | POA: Diagnosis not present

## 2023-04-09 DIAGNOSIS — F32A Depression, unspecified: Secondary | ICD-10-CM | POA: Diagnosis not present

## 2023-04-09 DIAGNOSIS — I444 Left anterior fascicular block: Secondary | ICD-10-CM | POA: Diagnosis not present

## 2023-04-09 DIAGNOSIS — F17210 Nicotine dependence, cigarettes, uncomplicated: Secondary | ICD-10-CM | POA: Diagnosis not present

## 2023-04-09 DIAGNOSIS — F419 Anxiety disorder, unspecified: Secondary | ICD-10-CM | POA: Diagnosis not present

## 2023-04-09 DIAGNOSIS — Z8781 Personal history of (healed) traumatic fracture: Secondary | ICD-10-CM | POA: Diagnosis not present

## 2023-04-09 DIAGNOSIS — E669 Obesity, unspecified: Secondary | ICD-10-CM | POA: Diagnosis not present

## 2023-04-11 DIAGNOSIS — Z96642 Presence of left artificial hip joint: Secondary | ICD-10-CM | POA: Diagnosis not present

## 2023-04-11 DIAGNOSIS — Z6841 Body Mass Index (BMI) 40.0 and over, adult: Secondary | ICD-10-CM | POA: Diagnosis not present

## 2023-04-11 DIAGNOSIS — F32A Depression, unspecified: Secondary | ICD-10-CM | POA: Diagnosis not present

## 2023-04-11 DIAGNOSIS — F17210 Nicotine dependence, cigarettes, uncomplicated: Secondary | ICD-10-CM | POA: Diagnosis not present

## 2023-04-11 DIAGNOSIS — E669 Obesity, unspecified: Secondary | ICD-10-CM | POA: Diagnosis not present

## 2023-04-11 DIAGNOSIS — E119 Type 2 diabetes mellitus without complications: Secondary | ICD-10-CM | POA: Diagnosis not present

## 2023-04-11 DIAGNOSIS — F419 Anxiety disorder, unspecified: Secondary | ICD-10-CM | POA: Diagnosis not present

## 2023-04-11 DIAGNOSIS — Z960 Presence of urogenital implants: Secondary | ICD-10-CM | POA: Diagnosis not present

## 2023-04-11 DIAGNOSIS — I10 Essential (primary) hypertension: Secondary | ICD-10-CM | POA: Diagnosis not present

## 2023-04-11 DIAGNOSIS — Z8781 Personal history of (healed) traumatic fracture: Secondary | ICD-10-CM | POA: Diagnosis not present

## 2023-04-11 DIAGNOSIS — I444 Left anterior fascicular block: Secondary | ICD-10-CM | POA: Diagnosis not present

## 2023-04-11 DIAGNOSIS — Z7984 Long term (current) use of oral hypoglycemic drugs: Secondary | ICD-10-CM | POA: Diagnosis not present

## 2023-04-11 DIAGNOSIS — E7800 Pure hypercholesterolemia, unspecified: Secondary | ICD-10-CM | POA: Diagnosis not present

## 2023-04-11 DIAGNOSIS — Z7982 Long term (current) use of aspirin: Secondary | ICD-10-CM | POA: Diagnosis not present

## 2023-04-11 DIAGNOSIS — I4510 Unspecified right bundle-branch block: Secondary | ICD-10-CM | POA: Diagnosis not present

## 2023-04-11 DIAGNOSIS — Z471 Aftercare following joint replacement surgery: Secondary | ICD-10-CM | POA: Diagnosis not present

## 2023-04-15 DIAGNOSIS — Z96642 Presence of left artificial hip joint: Secondary | ICD-10-CM | POA: Diagnosis not present

## 2023-04-15 DIAGNOSIS — Z8781 Personal history of (healed) traumatic fracture: Secondary | ICD-10-CM | POA: Diagnosis not present

## 2023-04-15 DIAGNOSIS — I444 Left anterior fascicular block: Secondary | ICD-10-CM | POA: Diagnosis not present

## 2023-04-15 DIAGNOSIS — Z960 Presence of urogenital implants: Secondary | ICD-10-CM | POA: Diagnosis not present

## 2023-04-15 DIAGNOSIS — Z471 Aftercare following joint replacement surgery: Secondary | ICD-10-CM | POA: Diagnosis not present

## 2023-04-15 DIAGNOSIS — Z7984 Long term (current) use of oral hypoglycemic drugs: Secondary | ICD-10-CM | POA: Diagnosis not present

## 2023-04-15 DIAGNOSIS — Z7982 Long term (current) use of aspirin: Secondary | ICD-10-CM | POA: Diagnosis not present

## 2023-04-15 DIAGNOSIS — E669 Obesity, unspecified: Secondary | ICD-10-CM | POA: Diagnosis not present

## 2023-04-15 DIAGNOSIS — F419 Anxiety disorder, unspecified: Secondary | ICD-10-CM | POA: Diagnosis not present

## 2023-04-15 DIAGNOSIS — E7800 Pure hypercholesterolemia, unspecified: Secondary | ICD-10-CM | POA: Diagnosis not present

## 2023-04-15 DIAGNOSIS — I10 Essential (primary) hypertension: Secondary | ICD-10-CM | POA: Diagnosis not present

## 2023-04-15 DIAGNOSIS — F17210 Nicotine dependence, cigarettes, uncomplicated: Secondary | ICD-10-CM | POA: Diagnosis not present

## 2023-04-15 DIAGNOSIS — F32A Depression, unspecified: Secondary | ICD-10-CM | POA: Diagnosis not present

## 2023-04-15 DIAGNOSIS — E119 Type 2 diabetes mellitus without complications: Secondary | ICD-10-CM | POA: Diagnosis not present

## 2023-04-15 DIAGNOSIS — Z6841 Body Mass Index (BMI) 40.0 and over, adult: Secondary | ICD-10-CM | POA: Diagnosis not present

## 2023-04-15 DIAGNOSIS — I4510 Unspecified right bundle-branch block: Secondary | ICD-10-CM | POA: Diagnosis not present

## 2023-04-16 ENCOUNTER — Encounter: Payer: Self-pay | Admitting: Physician Assistant

## 2023-04-16 ENCOUNTER — Telehealth: Payer: Self-pay | Admitting: Orthopaedic Surgery

## 2023-04-16 ENCOUNTER — Ambulatory Visit (INDEPENDENT_AMBULATORY_CARE_PROVIDER_SITE_OTHER): Payer: BC Managed Care – PPO | Admitting: Physician Assistant

## 2023-04-16 DIAGNOSIS — Z96642 Presence of left artificial hip joint: Secondary | ICD-10-CM

## 2023-04-16 MED ORDER — DOXYCYCLINE HYCLATE 100 MG PO TABS: 100.0000 mg | ORAL_TABLET | Freq: Two times a day (BID) | ORAL | 0 refills | Status: AC

## 2023-04-16 NOTE — Telephone Encounter (Signed)
Called and scheduled for 7/9 with Bronson Curb

## 2023-04-16 NOTE — Telephone Encounter (Signed)
Pt has one week follow up please advise not willing to be seen that Monday on the 15th would like to be worked in because of fear of possible infection

## 2023-04-16 NOTE — Progress Notes (Signed)
HPI: [name] returns today for follow-up status post left total hip arthroplasty 04/04/2023.  He is ambulating without any assistive device.  He denies any concerns.  Denies any fevers chills.  Describes his hip pain is achy.  Does have some knee pain now.  Ranks his pain overall the hip region on the left to be 3 out of 10.  Some taking Tylenol just for pain.  He is on aspirin 81 mg twice daily for DVT prophylaxis.  Physical exam: General well-developed well-nourished male in no acute distress mood and affect appropriate.  Left hip surgical incision significant erythema.  No dehiscence.  There is no malodor.  No drainage.  Positive effusion 58 cc of serosanguineous fluid was obtained today. Left calf supple nontender.  Dorsiflexion plantarflexion left ankle intact.   Impression: Status post left total hip arthroplasty  Plan: Staples removed Steri-Strips applied.  Will begin washing the wound with an antibacterial soap daily and completely drying it.  We did place him on doxycycline for 14 days.  Will see him back in 1 week sooner if there is any concerns or signs of infection about the incision.

## 2023-04-22 ENCOUNTER — Encounter: Payer: Self-pay | Admitting: Physician Assistant

## 2023-04-22 ENCOUNTER — Ambulatory Visit (INDEPENDENT_AMBULATORY_CARE_PROVIDER_SITE_OTHER): Payer: BC Managed Care – PPO | Admitting: Physician Assistant

## 2023-04-22 DIAGNOSIS — Z96642 Presence of left artificial hip joint: Secondary | ICD-10-CM

## 2023-04-22 NOTE — Progress Notes (Signed)
HPI: [name] returns today for follow-up of his left total hip wound.  He is taking doxycycline.  He is washing the wound with an antibacterial soap daily.  The patient states overall he is doing well.  No fevers or chills.  Does note that the seromas came back.  Physical exam: Left hip surgical incisions healing well there is some areas of slight dehiscence mid incision.  No drainage.  No malodor.  Erythema is decreased.  Positive seroma 48 cc of serosanguineous fluids aspirated today.  Ambulates without any assistive device.  Is able to get on and off the exam table on his own.  Impression: Status post left total hip arthroplasty 04/04/2023  Plan: He will continue to wash the incision with antibacterial soap.  Then we will apply a small amount of Bactroban particularly over the mid incision.  He will finish his doxycycline.  Will see him back in 2 weeks see how he is doing overall sooner if there is any questions or concerns.

## 2023-04-24 ENCOUNTER — Other Ambulatory Visit: Payer: BC Managed Care – PPO

## 2023-04-30 DIAGNOSIS — S9032XA Contusion of left foot, initial encounter: Secondary | ICD-10-CM | POA: Diagnosis not present

## 2023-05-05 ENCOUNTER — Ambulatory Visit (INDEPENDENT_AMBULATORY_CARE_PROVIDER_SITE_OTHER): Payer: BC Managed Care – PPO | Admitting: Physician Assistant

## 2023-05-05 ENCOUNTER — Encounter: Payer: Self-pay | Admitting: Physician Assistant

## 2023-05-05 DIAGNOSIS — Z96642 Presence of left artificial hip joint: Secondary | ICD-10-CM

## 2023-05-05 NOTE — Progress Notes (Signed)
HPI: [name] returns today status post left total hip arthroplasty 04/04/2023.  He is here for wound check.  States overall his wound is healing well.  Said no drainage.  He needs walking well and feels like the left hip is doing well overall.  Main complaint now is his right hip she has known osteoarthritis.  Physical exam: Left hip surgical incisions healing well.  3 stitches of Vicryl are removed today.  No purulence encountered.  Otherwise no signs of infection or drainage.  Excellent range of motion of the left hip without pain.  Impression: Status post left total hip arthroplasty 04/04/2019  Plan: He will continue to wash the wound with an antibacterial soap.  He is to refrain from submerging in bathtub or pool water.  He will follow-up with [place] in 1 month sooner if there is any questions concerns.  3-0 Vicryl stitches were removed today.

## 2023-05-14 ENCOUNTER — Ambulatory Visit: Payer: BC Managed Care – PPO

## 2023-05-14 DIAGNOSIS — R9431 Abnormal electrocardiogram [ECG] [EKG]: Secondary | ICD-10-CM

## 2023-05-14 DIAGNOSIS — E119 Type 2 diabetes mellitus without complications: Secondary | ICD-10-CM

## 2023-05-14 DIAGNOSIS — Z01810 Encounter for preprocedural cardiovascular examination: Secondary | ICD-10-CM

## 2023-05-15 NOTE — Progress Notes (Signed)
Echocardiogram 05/14/2023: Left ventricle cavity is normal in size. Normal left ventricular wall thickness. Normal global wall motion. Normal LV systolic function with EF 64%. Normal diastolic filling pattern. Bicuspid aortic valve with mild aortic valve leaflet calcification. No significant stenosis or regurgitation. The aortic root (3.5 cm) and proximal ascending aorta (3.6 cm) are upper normal limits in size. No evidence of pulmonary hypertension.

## 2023-05-23 ENCOUNTER — Other Ambulatory Visit: Payer: Self-pay | Admitting: Cardiology

## 2023-05-23 DIAGNOSIS — I10 Essential (primary) hypertension: Secondary | ICD-10-CM

## 2023-06-02 ENCOUNTER — Encounter: Payer: Self-pay | Admitting: Physician Assistant

## 2023-06-02 ENCOUNTER — Ambulatory Visit (INDEPENDENT_AMBULATORY_CARE_PROVIDER_SITE_OTHER): Payer: BC Managed Care – PPO | Admitting: Physician Assistant

## 2023-06-02 DIAGNOSIS — M1611 Unilateral primary osteoarthritis, right hip: Secondary | ICD-10-CM

## 2023-06-02 DIAGNOSIS — Z96642 Presence of left artificial hip joint: Secondary | ICD-10-CM

## 2023-06-02 NOTE — Progress Notes (Signed)
Office Visit Note   Patient: [name]           Date of Birth: 02-May-1966           MRN: [id number] Visit Date: 06/02/2023              Requested by: Soundra Pilon, FNP [address] [place],  [place] [address] PCP: Soundra Pilon, FNP   Assessment & Plan: Visit Diagnoses:  1. Unilateral primary osteoarthritis, right hip   2. Status post total replacement of left hip     Plan: [name] is failed conservative treatment of his right hip pain.  He understands the risk benefits of hip surgery as he has recently undergone left total hip arthroplasty.  Risks remain the same.  They include but are not limited to nerve/vessel injury, wound healing problems, leg length discrepancy, blood loss, and PE/DVT.  Questions were encouraged and answered at length.  Will work on scheduling him for sometime in November of late November 15.  Follow-Up Instructions: Return for post op.   Orders:  No orders of the defined types were placed in this encounter.  No orders of the defined types were placed in this encounter.     Procedures: No procedures performed   Clinical Data: No additional findings.   Subjective: Chief Complaint  Patient presents with   Left Hip - Follow-up    Left total hip arthroplasty 04/04/2023    HPI [name] returns today for follow-up of his left total hip arthroplasty.  He states the incision is well-healed.  He said no fevers chills.  No drainage.  He notes that his glucose levels are under good control.  His main complaint today is right hip pain.  He has known end-stage arthritis of the right hip.  He is wanting to schedule right total hip arthroplasty sometime in November.  OR scheduling sheet was filled out today.  Review of Systems  Constitutional:  Negative for chills and fever.  Respiratory:  Negative for shortness of breath.   Cardiovascular:  Negative for chest pain.     Objective: Vital Signs: There were no vitals taken for this  visit.  Physical Exam Constitutional:      Appearance: He is not ill-appearing or diaphoretic.  Pulmonary:     Effort: Pulmonary effort is normal.  Neurological:     Mental Status: He is alert and oriented to person, place, and time.     Ortho Exam Bilateral hips: Full range of motion left hip without pain.  Left hip full flexion.  Right hip virtually no internal or external rotation.  Discomfort with internal rotation.  Ambulates without any assistive device.  Specialty Comments:  No specialty comments available.  Imaging: No results found.   PMFS History: Patient Active Problem List   Diagnosis Date Noted   Status post total replacement of left hip 04/04/2023   Unilateral primary osteoarthritis, left hip 04/03/2023   Right knee pain 08/09/2016   Past Medical History:  Diagnosis Date   Anxiety    Arthritis    Depression    Diabetes mellitus without complication (HCC)    type 2   Heart murmur    childhood heart murmur   High cholesterol    History of kidney stones    Hypertension     Family History  Problem Relation Age of Onset   Stroke Mother    Hypertension Father    Heart attack Father    Diabetes Father  Past Surgical History:  Procedure Laterality Date   CYSTOSCOPY WITH URETEROSCOPY AND STENT PLACEMENT Left 05/18/2013   Procedure: LEFT URETEROSCOPY, Retrograde Pyelogram, Left Ureteral Dilation,STONE EXTRACTION, STENT PLACEMENT ;  Surgeon: Anner Crete, MD;  Location: [facility];  Service: Urology;  Laterality: Left;   CYSTOSCOPY/URETEROSCOPY/HOLMIUM LASER/STENT PLACEMENT Right 03/16/2015   Procedure: CYSTOSCOPY/URETEROSCOPY/STONE EXTRACTION/HOLMIUM LASER/STENT PLACEMENT;  Surgeon: Bjorn Pippin, MD;  Location: [facility];  Service: Urology;  Laterality: Right;   EXTRACORPOREAL SHOCK WAVE LITHOTRIPSY Right 02/20/2017   Procedure: RIGHT EXTRACORPOREAL SHOCK WAVE LITHOTRIPSY (ESWL);  Surgeon: Malen Gauze, MD;   Location: [facility];  Service: Urology;  Laterality: Right;   EXTRACORPOREAL SHOCK WAVE LITHOTRIPSY Left 02/25/2019   Procedure: EXTRACORPOREAL SHOCK WAVE LITHOTRIPSY (ESWL);  Surgeon: Alfredo Martinez, MD;  Location: [facility];  Service: Urology;  Laterality: Left;   HOLMIUM LASER APPLICATION Left 05/18/2013   Procedure: HOLMIUM LASER APPLICATION;  Surgeon: Anner Crete, MD;  Location: [facility];  Service: Urology;  Laterality: Left;   KNEE ARTHROSCOPY Right 09/2016   KNEE ARTHROSCOPY Left    reconstruction    KNEE SURGERY     ORIF LEFT PROXIMAL TIBIAL FX  2006   TOTAL HIP ARTHROPLASTY Left 04/04/2023   Procedure: LEFT TOTAL HIP ARTHROPLASTY ANTERIOR APPROACH;  Surgeon: Kathryne Hitch, MD;  Location: [facility];  Service: Orthopedics;  Laterality: Left;  RNFA   VASECTOMY Bilateral 05/18/2013   Procedure: BILATERAL VASECTOMY;  Surgeon: Anner Crete, MD;  Location: [facility];  Service: Urology;  Laterality: Bilateral;   Social History   Occupational History   Not on file  Tobacco Use   Smoking status: Every Day    Current packs/day: 1.00    Average packs/day: 1 pack/day for 26.0 years (26.0 ttl pk-yrs)    Types: Cigarettes   Smokeless tobacco: Never   Tobacco comments:    trying to quit 3 cig per day  Vaping Use   Vaping status: Never Used  Substance and Sexual Activity   Alcohol use: Yes    Alcohol/week: 2.0 standard drinks of alcohol    Types: 2 Standard drinks or equivalent per week    Comment: daily   Drug use: No   Sexual activity: Not on file

## 2023-06-11 ENCOUNTER — Other Ambulatory Visit: Payer: Self-pay | Admitting: Cardiology

## 2023-06-11 DIAGNOSIS — E7800 Pure hypercholesterolemia, unspecified: Secondary | ICD-10-CM

## 2023-06-20 ENCOUNTER — Other Ambulatory Visit: Payer: Self-pay | Admitting: Cardiology

## 2023-06-20 DIAGNOSIS — I10 Essential (primary) hypertension: Secondary | ICD-10-CM

## 2023-06-25 ENCOUNTER — Encounter: Payer: Self-pay | Admitting: Cardiology

## 2023-06-25 ENCOUNTER — Ambulatory Visit: Payer: BC Managed Care – PPO | Admitting: Cardiology

## 2023-06-25 VITALS — BP 134/83 | HR 99 | Resp 16 | Ht 70.0 in | Wt 258.0 lb

## 2023-06-25 DIAGNOSIS — F17210 Nicotine dependence, cigarettes, uncomplicated: Secondary | ICD-10-CM | POA: Diagnosis not present

## 2023-06-25 DIAGNOSIS — E119 Type 2 diabetes mellitus without complications: Secondary | ICD-10-CM

## 2023-06-25 DIAGNOSIS — F17209 Nicotine dependence, unspecified, with unspecified nicotine-induced disorders: Secondary | ICD-10-CM | POA: Diagnosis not present

## 2023-06-25 DIAGNOSIS — R9439 Abnormal result of other cardiovascular function study: Secondary | ICD-10-CM

## 2023-06-25 DIAGNOSIS — Q231 Congenital insufficiency of aortic valve: Secondary | ICD-10-CM

## 2023-06-25 DIAGNOSIS — I10 Essential (primary) hypertension: Secondary | ICD-10-CM

## 2023-06-25 MED ORDER — AMLODIPINE BESYLATE 5 MG PO TABS: 5.0000 mg | ORAL_TABLET | Freq: Every day | ORAL | 3 refills | Status: DC

## 2023-06-25 MED ORDER — EMPAGLIFLOZIN 10 MG PO TABS
10.0000 mg | ORAL_TABLET | Freq: Every day | ORAL | Status: DC
Start: 2023-06-25 — End: 2023-06-30

## 2023-06-25 MED ORDER — OLMESARTAN MEDOXOMIL-HCTZ 20-12.5 MG PO TABS: 1.0000 | ORAL_TABLET | ORAL | 3 refills | Status: DC

## 2023-06-25 MED ORDER — NICOTINE 21 MG/24HR TD PT24
21.0000 mg | MEDICATED_PATCH | Freq: Every day | TRANSDERMAL | 0 refills | Status: DC
Start: 2023-06-25 — End: 2023-12-04

## 2023-06-25 MED ORDER — ASPIRIN 81 MG PO CHEW
81.0000 mg | CHEWABLE_TABLET | Freq: Every day | ORAL | 3 refills | Status: DC
Start: 2023-06-25 — End: 2023-07-03

## 2023-06-25 NOTE — Progress Notes (Signed)
Primary Physician/Referring:  Soundra Pilon, FNP  Patient ID: [name], male    DOB: April 23, 1966, 57 y.o.   MRN: [id number]  Chief Complaint  Patient presents with   Hypertension   Follow-up   HPI:    [name]  is a 57 y.o. Caucasian male patient with hypertension, hypercholesterolemia, diabetes mellitus, tobacco use disorder, obesity presents here for follow-up of hypertension and hypercholesterolemia and I had performed preoperative cardiovascular risk stratification.  He underwent left total hip arthroplasty on 04/04/2023 without periprocedural complications.  He has no specific complaints.  Past Medical History:  Diagnosis Date   Anxiety    Arthritis    Depression    Diabetes mellitus without complication (HCC)    type 2   Heart murmur    childhood heart murmur   High cholesterol    History of kidney stones    Hypertension    Past Surgical History:  Procedure Laterality Date   CYSTOSCOPY WITH URETEROSCOPY AND STENT PLACEMENT Left 05/18/2013   Procedure: LEFT URETEROSCOPY, Retrograde Pyelogram, Left Ureteral Dilation,STONE EXTRACTION, STENT PLACEMENT ;  Surgeon: Anner Crete, MD;  Location: [facility];  Service: Urology;  Laterality: Left;   CYSTOSCOPY/URETEROSCOPY/HOLMIUM LASER/STENT PLACEMENT Right 03/16/2015   Procedure: CYSTOSCOPY/URETEROSCOPY/STONE EXTRACTION/HOLMIUM LASER/STENT PLACEMENT;  Surgeon: Bjorn Pippin, MD;  Location: [facility];  Service: Urology;  Laterality: Right;   EXTRACORPOREAL SHOCK WAVE LITHOTRIPSY Right 02/20/2017   Procedure: RIGHT EXTRACORPOREAL SHOCK WAVE LITHOTRIPSY (ESWL);  Surgeon: Malen Gauze, MD;  Location: [facility];  Service: Urology;  Laterality: Right;   EXTRACORPOREAL SHOCK WAVE LITHOTRIPSY Left 02/25/2019   Procedure: EXTRACORPOREAL SHOCK WAVE LITHOTRIPSY (ESWL);  Surgeon: Alfredo Martinez, MD;  Location: [facility];  Service: Urology;  Laterality: Left;   HOLMIUM LASER APPLICATION Left  05/18/2013   Procedure: HOLMIUM LASER APPLICATION;  Surgeon: Anner Crete, MD;  Location: [facility];  Service: Urology;  Laterality: Left;   KNEE ARTHROSCOPY Right 09/2016   KNEE ARTHROSCOPY Left    reconstruction    KNEE SURGERY     ORIF LEFT PROXIMAL TIBIAL FX  2006   TOTAL HIP ARTHROPLASTY Left 04/04/2023   Procedure: LEFT TOTAL HIP ARTHROPLASTY ANTERIOR APPROACH;  Surgeon: Kathryne Hitch, MD;  Location: [facility];  Service: Orthopedics;  Laterality: Left;  RNFA   VASECTOMY Bilateral 05/18/2013   Procedure: BILATERAL VASECTOMY;  Surgeon: Anner Crete, MD;  Location: [facility];  Service: Urology;  Laterality: Bilateral;   Family History  Problem Relation Age of Onset   Stroke Mother    Hypertension Father    Heart attack Father    Diabetes Father     Social History   Tobacco Use   Smoking status: Every Day    Current packs/day: 1.00    Average packs/day: 1 pack/day for 26.0 years (26.0 ttl pk-yrs)    Types: Cigarettes   Smokeless tobacco: Never   Tobacco comments:    trying to quit 3 cig per day  Substance Use Topics   Alcohol use: Yes    Alcohol/week: 2.0 standard drinks of alcohol    Types: 2 Standard drinks or equivalent per week    Comment: daily   Marital Status: Married  ROS  Review of Systems  Cardiovascular:  Negative for chest pain, dyspnea on exertion and leg swelling.   Objective      06/25/2023    3:35 PM 04/05/2023    9:50 AM 04/05/2023    6:38 AM  Vitals with BMI  Height 5\' 10"     Weight 258 lbs    BMI 37.02    Systolic 134 110 841  Diastolic 83 80 88  Pulse 99 69 63   Blood pressure 134/83, pulse 99, resp. rate 16, height 5\' 10"  (1.778 m), weight 258 lb (117 kg), SpO2 97%.  Physical Exam Constitutional:      Appearance: He is obese.  Neck:     Vascular: No carotid bruit or JVD.  Cardiovascular:     Rate and Rhythm: Regular rhythm. Tachycardia present.     Pulses: Intact distal pulses.     Heart  sounds: Normal heart sounds. No murmur heard.    No gallop.  Pulmonary:     Effort: Pulmonary effort is normal.     Breath sounds: Normal breath sounds.  Abdominal:     General: Bowel sounds are normal.     Palpations: Abdomen is soft.  Musculoskeletal:     Right lower leg: No edema.     Left lower leg: No edema.    Laboratory examination:   Recent Labs    03/27/23 0930 04/05/23 0336  NA 138 132*  K 4.1 4.6  CL 106 103  CO2 24 25  GLUCOSE 125* 140*  BUN 19 19  CREATININE 0.71 0.91  CALCIUM 9.3 8.7*  GFRNONAA >60 >60    Lab Results  Component Value Date   GLUCOSE 140 (H) 04/05/2023   NA 132 (L) 04/05/2023   K 4.6 04/05/2023   CL 103 04/05/2023   CO2 25 04/05/2023   BUN 19 04/05/2023   CREATININE 0.91 04/05/2023   GFRNONAA >60 04/05/2023   CALCIUM 8.7 (L) 04/05/2023   PROT 7.7 03/27/2023   ALBUMIN 4.4 03/27/2023   BILITOT 0.9 03/27/2023   ALKPHOS 62 03/27/2023   AST 25 03/27/2023   ALT 25 03/27/2023   ANIONGAP 4 (L) 04/05/2023      Lab Results  Component Value Date   ALT 25 03/27/2023   AST 25 03/27/2023   ALKPHOS 62 03/27/2023   BILITOT 0.9 03/27/2023       Latest Ref Rng & Units 03/27/2023    9:30 AM  Hepatic Function  Total Protein 6.5 - 8.1 g/dL 7.7   Albumin 3.5 - 5.0 g/dL 4.4   AST 15 - 41 U/L 25   ALT 0 - 44 U/L 25   Alk Phosphatase 38 - 126 U/L 62   Total Bilirubin 0.3 - 1.2 mg/dL 0.9    Lipid Panel No results found for: "CHOL", "HDL", "LDLCALC", "LDLDIRECT", "TRIG", "CHOLHDL"   HEMOGLOBIN A1C Lab Results  Component Value Date   HGBA1C 6.1 (H) 03/27/2023   MPG 128.37 03/27/2023   BNP (last 3 results) No results for input(s): "BNP" in the last 8760 hours.  ProBNP (last 3 results) No results for input(s): "PROBNP" in the last 8760 hours.  No results found for: "TSH"    External labs:   Labs 06/22/2022:  Total cholesterol 200, triglycerides 123, HDL 67, LDL 112, non-HDL cholesterol 113.  TSH normal at 2.25.  A1c  6.0%.  Serum glucose 122, BUN 20, creatinine 0.88, EGFR 100 more mL, potassium 4.2, LFTs normal.  Hb 15.4/HCT 44.6, platelets 141.  Radiology:    Cardiac Studies:   PCV ECHOCARDIOGRAM COMPLETE 05/14/2023  Narrative Echocardiogram 05/14/2023: Left ventricle cavity is normal in size. Normal left ventricular wall thickness. Normal global wall motion. Normal LV systolic function with EF 64%. Normal diastolic filling pattern. Bicuspid aortic valve with  mild aortic valve leaflet calcification. No significant stenosis or regurgitation. The aortic root (3.5 cm) and proximal ascending aorta (3.6 cm) are upper normal limits in size. No evidence of pulmonary hypertension.    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 LV is mildly dilated both in rest and stress. TID ratio 0.99, which is normal.  There is mild hypokinesis infero-apical region.  Stress LV EF is mildly dysfunctional 41%. Nondiagnostic ECG stress. The heart rate response was consistent with Regadenoson. No previous exam available for comparison. Intermediate risk.    EKG:   EKG 03/31/2023: Normal sinus rhythm with rate of 88 bpm, leftward enlargement, left axis deviation, left anterior fascicular block.  Right bundle branch block.  Bifascicular block.  No evidence of ischemia.  EKG 03/27/2023: Normal sinus rhythm at rate of 83 bpm, left axis deviation, left intrafascicular block.  Right bundle branch block.  Bifascicular block.  Right bundle branch block new compared to 05/09/2005.    Medications and allergies  No Known Allergies   Medication list   Current Outpatient Medications:    aspirin (ASPIRIN  CHILDRENS) 81 MG chewable tablet, Chew 1 tablet (81 mg total) by mouth daily., Disp: 90 tablet, Rfl: 3   empagliflozin (JARDIANCE) 10 MG TABS tablet, Take 1 tablet (10 mg total) by mouth daily before breakfast., Disp: , Rfl:    glucose blood test strip, 1 each by Other route as needed for other. Use as instructed, Disp: , Rfl:    Menthol, Topical Analgesic, (BIOFREEZE EX), Apply 1 Application topically daily as needed (knee pain)., Disp: , Rfl:    metFORMIN (GLUCOPHAGE-XR) 500 MG 24 hr tablet, Take 500 mg by mouth every evening., Disp: , Rfl:    metoprolol succinate (TOPROL-XL) 50 MG 24 hr tablet, Take 1 tablet (50 mg total) by mouth daily. Take with or immediately following a meal., Disp: 90 tablet, Rfl: 3   nicotine (NICODERM CQ) 21 mg/24hr patch, Place 1 patch (21 mg total) onto the skin daily., Disp: 28 patch, Rfl: 0   rosuvastatin (CRESTOR) 20 MG tablet, TAKE 1 TABLET(20 MG) BY MOUTH DAILY, Disp: 90 tablet, Rfl: 3   sertraline (ZOLOFT) 100 MG tablet, Take 150 mg by mouth daily., Disp: , Rfl:    sildenafil (VIAGRA) 50 MG tablet, Take 50 mg by mouth daily as needed for erectile dysfunction. 1-2 daily prn, Disp: , Rfl:    amLODipine (NORVASC) 5 MG tablet, Take 1 tablet (5 mg total) by mouth daily., Disp: 90 tablet, Rfl: 3   olmesartan-hydrochlorothiazide (BENICAR HCT) 20-12.5 MG tablet, Take 1 tablet by mouth every morning., Disp: 90 tablet, Rfl: 3  Assessment     ICD-10-CM   1. Abnormal nuclear stress test  R94.39 aspirin (ASPIRIN CHILDRENS) 81 MG chewable tablet    metoprolol succinate (TOPROL-XL) 50 MG 24 hr tablet    2. Bicuspid aortic valve  Q23.1     3. Primary hypertension  I10 olmesartan-hydrochlorothiazide (BENICAR HCT) 20-12.5 MG tablet    amLODipine (NORVASC) 5 MG tablet    metoprolol succinate (TOPROL-XL) 50 MG 24 hr tablet    4. Type 2 diabetes mellitus without complication, without long-term current use of insulin (HCC)  E11.9 empagliflozin (JARDIANCE) 10 MG TABS tablet  5. Tobacco use disorder, continuous  F17.209 nicotine (NICODERM CQ) 21 mg/24hr patch        No orders of the defined types were placed in this encounter.   Meds ordered this encounter  Medications   nicotine (NICODERM CQ) 21 mg/24hr patch    Sig: Place 1 patch (21 mg total) onto the skin daily.    Dispense:  28 patch    Refill:  0   empagliflozin (JARDIANCE) 10 MG TABS tablet    Sig: Take 1 tablet (10 mg total) by mouth daily before breakfast.   olmesartan-hydrochlorothiazide (BENICAR HCT) 20-12.5 MG tablet    Sig: Take 1 tablet by mouth every morning.    Dispense:  90 tablet    Refill:  3   amLODipine (NORVASC) 5 MG tablet    Sig: Take 1 tablet (5 mg total) by mouth daily.    Dispense:  90 tablet    Refill:  3   aspirin (ASPIRIN CHILDRENS) 81 MG chewable tablet    Sig: Chew 1 tablet (81 mg total) by mouth daily.    Dispense:  90 tablet    Refill:  3   metoprolol succinate (TOPROL-XL) 50 MG 24 hr tablet    Sig: Take 1 tablet (50 mg total) by mouth daily. Take with or immediately following a meal.    Dispense:  90 tablet    Refill:  3    Medications Discontinued During This Encounter  Medication Reason   meloxicam (MOBIC) 7.5 MG tablet    methocarbamol (ROBAXIN) 500 MG tablet    metoprolol succinate (TOPROL-XL) 100 MG 24 hr tablet    olmesartan-hydrochlorothiazide (BENICAR HCT) 20-12.5 MG tablet Reorder   amLODipine (NORVASC) 5 MG tablet Reorder     Recommendations:   [name] is a 57 y.o. Caucasian male patient with hypertension, hypercholesterolemia, diabetes mellitus, tobacco use disorder, obesity presents here for follow-up of hypertension and hypercholesterolemia and I had performed preoperative cardiovascular risk stratification.  He underwent left total hip arthroplasty on 04/04/2023 without periprocedural complications.  1. Abnormal nuclear stress test Patient has mildly abnormal nuclear stress test and I suspect he probably has underlying coronary artery  disease.  However on medical therapy he remains asymptomatic and no clinical evidence of heart failure, underwent hip replacement without any periprocedural complications, I would recommend continuing aggressive risk factor modification and aggressive risk evaluation.  He is presently on Crestor 20 mg daily which was started recently, he has follow-up appointments with his PCP for management of lipids as well.  He has been scheduled for repeat right hip arthroplasty which I do not see any contraindication.  He has recuperated well.  Advised him to start aspirin 81 mg daily. He had 100 mg of metoprolol succinate previously however as he did not have any refills he has discontinued this.  I have refilled the prescription, but will reduce the dose to 50 mg as his blood pressure is now well-controlled.  However his heart rate has increased.  - aspirin (ASPIRIN CHILDRENS) 81 MG chewable tablet; Chew 1 tablet (81 mg total) by mouth daily.  Dispense: 90 tablet; Refill: 3  2. Bicuspid aortic valve Echocardiogram reveals bicuspid aortic valve.  He also has very minimal aortic root dilatation but probably normal in size for his age, height and weight.  Will continue to monitor with annual echocardiogram if indicated.  3. Primary hypertension Blood pressure - olmesartan-hydrochlorothiazide (BENICAR HCT) 20-12.5 MG tablet; Take 1 tablet by mouth every morning.  Dispense: 90 tablet;  Refill: 3 - amLODipine (NORVASC) 5 MG tablet; Take 1 tablet (5 mg total) by mouth daily.  Dispense: 90 tablet; Refill: 3  4. Type 2 diabetes mellitus without complication, without long-term current use of insulin (HCC) Patient diabetes is not well-controlled, I will go ahead and start him on Jardiance which will help with weight loss as well.  Consider initiation of Ozempic, probably before [facility] which would certainly help with weight loss.  - empagliflozin (JARDIANCE) 10 MG TABS tablet; Take 1 tablet (10 mg total) by mouth daily  before breakfast.  5. Tobacco use disorder, continuous Patient for the first time appears to be motivated for smoking cessation.  I will go ahead and send for nicotine patches, he will discuss with his PCP to refill his prescription for lower dose.  I would like to see him back in 6 months  or sooner if problems. - nicotine (NICODERM CQ) 21 mg/24hr patch; Place 1 patch (21 mg total) onto the skin daily.  Dispense: 28 patch; Refill: 0    Yates Decamp, MD, [facility] 06/28/2023, 5:48 PM Office: [phone]

## 2023-06-27 DIAGNOSIS — E785 Hyperlipidemia, unspecified: Secondary | ICD-10-CM | POA: Diagnosis not present

## 2023-06-27 DIAGNOSIS — R350 Frequency of micturition: Secondary | ICD-10-CM | POA: Diagnosis not present

## 2023-06-27 DIAGNOSIS — E1169 Type 2 diabetes mellitus with other specified complication: Secondary | ICD-10-CM | POA: Diagnosis not present

## 2023-06-27 DIAGNOSIS — I10 Essential (primary) hypertension: Secondary | ICD-10-CM | POA: Diagnosis not present

## 2023-06-27 DIAGNOSIS — Z125 Encounter for screening for malignant neoplasm of prostate: Secondary | ICD-10-CM | POA: Diagnosis not present

## 2023-06-27 DIAGNOSIS — Z0000 Encounter for general adult medical examination without abnormal findings: Secondary | ICD-10-CM | POA: Diagnosis not present

## 2023-06-28 ENCOUNTER — Encounter: Payer: Self-pay | Admitting: Cardiology

## 2023-06-28 DIAGNOSIS — E119 Type 2 diabetes mellitus without complications: Secondary | ICD-10-CM

## 2023-06-28 MED ORDER — METOPROLOL SUCCINATE ER 50 MG PO TB24
50.0000 mg | ORAL_TABLET | Freq: Every day | ORAL | 3 refills | Status: DC
Start: 2023-06-28 — End: 2024-06-03

## 2023-06-30 ENCOUNTER — Other Ambulatory Visit: Payer: Self-pay

## 2023-06-30 DIAGNOSIS — E119 Type 2 diabetes mellitus without complications: Secondary | ICD-10-CM

## 2023-06-30 MED ORDER — EMPAGLIFLOZIN 10 MG PO TABS
10.0000 mg | ORAL_TABLET | Freq: Every day | ORAL | 3 refills | Status: DC
Start: 2023-06-30 — End: 2023-07-01

## 2023-07-01 MED ORDER — EMPAGLIFLOZIN 25 MG PO TABS
25.0000 mg | ORAL_TABLET | Freq: Every day | ORAL | 1 refills | Status: DC
Start: 2023-07-01 — End: 2023-08-29

## 2023-07-01 NOTE — Telephone Encounter (Signed)
From patient.

## 2023-07-01 NOTE — Telephone Encounter (Signed)
ICD-10-CM   1. Type 2 diabetes mellitus without complication, without long-term current use of insulin (HCC)  E11.9 empagliflozin (JARDIANCE) 25 MG TABS tablet     Medications Discontinued During This Encounter  Medication Reason   empagliflozin (JARDIANCE) 10 MG TABS tablet Dose change   Meds ordered this encounter  Medications   empagliflozin (JARDIANCE) 25 MG TABS tablet    Sig: Take 1 tablet (25 mg total) by mouth daily before breakfast.    Dispense:  90 tablet    Refill:  1

## 2023-07-10 DIAGNOSIS — G4733 Obstructive sleep apnea (adult) (pediatric): Secondary | ICD-10-CM | POA: Diagnosis not present

## 2023-07-28 ENCOUNTER — Other Ambulatory Visit: Payer: Self-pay | Admitting: Cardiology

## 2023-07-28 DIAGNOSIS — I10 Essential (primary) hypertension: Secondary | ICD-10-CM

## 2023-07-31 ENCOUNTER — Telehealth: Payer: Self-pay | Admitting: Cardiology

## 2023-07-31 DIAGNOSIS — I10 Essential (primary) hypertension: Secondary | ICD-10-CM

## 2023-07-31 MED ORDER — AMLODIPINE BESYLATE 5 MG PO TABS
5.0000 mg | ORAL_TABLET | Freq: Every day | ORAL | 3 refills | Status: AC
Start: 2023-07-31 — End: ?

## 2023-07-31 NOTE — Telephone Encounter (Signed)
*  STAT* If patient is at the pharmacy, call can be transferred to refill team.   1. Which medications need to be refilled? (please list name of each medication and dose if known)  amLODipine (NORVASC) 5 MG tablet  2. Which pharmacy/location (including street and city if local pharmacy) is medication to be sent to? [id number] - [place], [place] - [address]   3. Do they need a 30 day or 90 day supply?  90 day supply

## 2023-07-31 NOTE — Telephone Encounter (Signed)
Pt's medication was sent to pt's pharmacy as requested. Confirmation received.  °

## 2023-08-17 NOTE — Patient Instructions (Addendum)
SURGICAL WAITING ROOM VISITATION Patients having surgery or a procedure may have no more than 2 support people in the waiting area - these visitors may rotate in the visitor waiting room.   Due to an increase in RSV and influenza rates and associated hospitalizations, children ages 97 and under may not visit patients in [facility] hospitals. If the patient needs to stay at the hospital during part of their recovery, the visitor guidelines for inpatient rooms apply.  PRE-OP VISITATION  Pre-op nurse will coordinate an appropriate time for 1 support person to accompany the patient in pre-op.  This support person may not rotate.  This visitor will be contacted when the time is appropriate for the visitor to come back in the pre-op area.  Please refer to the [facility] website for the visitor guidelines for Inpatients (after your surgery is over and you are in a regular room).  You are not required to quarantine at this time prior to your surgery. However, you must do this: Hand Hygiene often Do NOT share personal items Notify your provider if you are in close contact with someone who has COVID or you develop fever 100.4 or greater, new onset of sneezing, cough, sore throat, shortness of breath or body aches.  If you test positive for Covid or have been in contact with anyone that has tested positive in the last 10 days please notify you surgeon.    Your procedure is scheduled on:  FRIDAY   August 29, 2023  Report to [facility] Main Entrance: [facility] entrance where the [employer] is available.   Report to admitting at: 05:15    AM  Call this number if you have any questions or problems the morning of surgery [phone]   Do not eat food after Midnight the night prior to your surgery/procedure.  After Midnight you may have the following liquids until  04:15 AM  DAY OF SURGERY  Clear Liquid Diet Water Black Coffee (sugar ok, NO MILK/CREAM OR CREAMERS)  Tea (sugar ok, NO  MILK/CREAM OR CREAMERS) regular and decaf                             Plain Jell-O  with no fruit (NO RED)                                           Fruit ices (not with fruit pulp, NO RED)                                     Popsicles (NO RED)                                                                  Juice: NO CITRUS JUICES: only apple, WHITE grape, WHITE cranberry Sports drinks like Gatorade or Powerade (NO RED)                    The day of surgery:  Drink ONE (1) Pre-Surgery G2 at  04:15 AM the morning  of surgery. Drink in one sitting. Do not sip.  This drink was given to you during your hospital pre-op appointment visit. Nothing else to drink after completing the Pre-Surgery G2 : No candy, chewing gum or throat lozenges.    FOLLOW ANY ADDITIONAL PRE OP INSTRUCTIONS YOU RECEIVED FROM YOUR SURGEON'S OFFICE!!!   Oral Hygiene is also important to reduce your risk of infection.        Remember - BRUSH YOUR TEETH THE MORNING OF SURGERY WITH YOUR REGULAR TOOTHPASTE  Do NOT smoke after Midnight the night before surgery.  OZEMPIC Sunday  Hold x 7 days   Last dose was on: 08-17-2023  Jardiance  10mg  daily - Hold x 72 hours   Last dose will be taken: Monday 08-25-23  Metformin 500mg  q PM,   Day before surgery: take as usual.   DAY OF SURGERY: DO NOT TAKE METFORMIN    STOP TAKING all Vitamins, Herbs and supplements 1 week before your surgery.   Take ONLY these medicines the morning of surgery with A SIP OF WATER: amlodipine, metoprolol, sertraline (Zoloft).   If You have been diagnosed with Sleep Apnea - Bring CPAP mask and tubing day of surgery. We will provide you with a CPAP machine on the day of your surgery.                   You may not have any metal on your body including  jewelry, and body piercing  Do not wear  lotions, powders, cologne, or deodorant  Men may shave face and neck.  You may bring a small overnight bag with you on the day of surgery, only pack items  that are not valuable. [facility] IS NOT RESPONSIBLE   FOR VALUABLES THAT ARE LOST OR STOLEN.   Do not bring your home medications to the hospital. The Pharmacy will dispense medications listed on your medication list to you during your admission in the Hospital.  Please read over the following fact sheets you were given: IF YOU HAVE QUESTIONS ABOUT YOUR PRE-OP INSTRUCTIONS, PLEASE CALL [phone].     Pre-operative 5 CHG Bath Instructions   You can play a key role in reducing the risk of infection after surgery. Your skin needs to be as free of germs as possible. You can reduce the number of germs on your skin by washing with CHG (chlorhexidine gluconate) soap before surgery. CHG is an antiseptic soap that kills germs and continues to kill germs even after washing.   DO NOT use if you have an allergy to chlorhexidine/CHG or antibacterial soaps. If your skin becomes reddened or irritated, stop using the CHG and notify one of our RNs at [phone]  Please shower with the CHG soap starting 4 days before surgery using the following schedule: START SHOWERS ON   MONDAY  August 25, 2023  Please keep in mind the following:  DO NOT shave, including legs and underarms, starting the day of your first shower.   You may shave your face at any point before/day of surgery.   Place clean sheets on your bed the day you start using CHG soap. Use a clean washcloth (not used since being washed) for each shower. DO NOT sleep with pets once you start using the CHG.   CHG Shower Instructions:  If you choose to wash your hair and private area, wash first with your normal shampoo/soap.  After you use shampoo/soap, rinse your hair and body thoroughly to remove shampoo/soap residue.  Turn the water OFF and apply about 3  tablespoons (45 ml) of CHG soap to a CLEAN washcloth.  Apply CHG soap ONLY FROM YOUR NECK DOWN TO YOUR TOES (washing for 3-5 minutes)  DO NOT use CHG soap on face, private areas, open wounds, or sores.  Pay special attention to the area where your surgery is being performed.  If you are having back surgery, having someone wash your back for you may be helpful.  Wait 2 minutes after CHG soap is applied, then you may rinse off the CHG soap.  Pat dry with a clean towel  Put on clean clothes/pajamas   If you choose to wear lotion, please use ONLY the CHG-compatible lotions on the back of this paper.     Additional instructions for the day of surgery: DO NOT APPLY any lotions, deodorants, cologne, or perfumes.   Put on clean/comfortable clothes.  Brush your teeth.  Ask your nurse before applying any prescription medications to the skin.      CHG Compatible Lotions   Aveeno Moisturizing lotion  Cetaphil Moisturizing Cream  Cetaphil Moisturizing Lotion  Clairol Herbal Essence Moisturizing Lotion, Dry Skin  Clairol Herbal Essence Moisturizing Lotion, Extra Dry Skin  Clairol Herbal Essence Moisturizing Lotion, Normal Skin  Curel Age Defying Therapeutic Moisturizing Lotion with Alpha Hydroxy  Curel Extreme Care Body Lotion  Curel Soothing Hands Moisturizing Hand Lotion  Curel Therapeutic Moisturizing Cream, Fragrance-Free  Curel Therapeutic Moisturizing Lotion, Fragrance-Free  Curel Therapeutic Moisturizing Lotion, Original Formula  Eucerin Daily Replenishing Lotion  Eucerin Dry Skin Therapy Plus Alpha Hydroxy Crme  Eucerin Dry Skin Therapy Plus Alpha Hydroxy Lotion  Eucerin Original Crme  Eucerin Original Lotion  Eucerin Plus Crme Eucerin Plus Lotion  Eucerin TriLipid Replenishing Lotion  Keri Anti-Bacterial Hand Lotion  Keri Deep Conditioning Original Lotion Dry Skin Formula Softly Scented  Keri Deep Conditioning Original Lotion, Fragrance Free Sensitive Skin Formula  Keri  Lotion Fast Absorbing Fragrance Free Sensitive Skin Formula  Keri Lotion Fast Absorbing Softly Scented Dry Skin Formula  Keri Original Lotion  Keri Skin Renewal Lotion Keri Silky Smooth Lotion  Keri Silky Smooth Sensitive Skin Lotion  Nivea Body Creamy Conditioning Oil  Nivea Body Extra Enriched Lotion  Nivea Body Original Lotion  Nivea Body Sheer Moisturizing Lotion Nivea Crme  Nivea Skin Firming Lotion  NutraDerm 30 Skin Lotion  NutraDerm Skin Lotion  NutraDerm Therapeutic Skin Cream  NutraDerm Therapeutic Skin Lotion  ProShield Protective Hand Cream  Provon moisturizing lotion   FAILURE TO FOLLOW THESE INSTRUCTIONS MAY RESULT IN THE CANCELLATION OF YOUR SURGERY  PATIENT SIGNATURE_________________________________  NURSE SIGNATURE__________________________________  ________________________________________________________________________       [name]    An incentive spirometer is a tool that can help keep your lungs clear and active. This tool measures how well you are filling your lungs with each breath. Taking  long deep breaths may help reverse or decrease the chance of developing breathing (pulmonary) problems (especially infection) following: A long period of time when you are unable to move or be active. BEFORE THE PROCEDURE  If the spirometer includes an indicator to show your best effort, your nurse or respiratory therapist will set it to a desired goal. If possible, sit up straight or lean slightly forward. Try not to slouch. Hold the incentive spirometer in an upright position. INSTRUCTIONS FOR USE  Sit on the edge of your bed if possible, or sit up as far as you can in bed or on a chair. Hold the incentive spirometer in an upright position. Breathe out normally. Place the mouthpiece in your mouth and seal your lips tightly around it. Breathe in slowly and as deeply as possible, raising the piston or the ball toward the top of the column. Hold  your breath for 3-5 seconds or for as long as possible. Allow the piston or ball to fall to the bottom of the column. Remove the mouthpiece from your mouth and breathe out normally. Rest for a few seconds and repeat Steps 1 through 7 at least 10 times every 1-2 hours when you are awake. Take your time and take a few normal breaths between deep breaths. The spirometer may include an indicator to show your best effort. Use the indicator as a goal to work toward during each repetition. After each set of 10 deep breaths, practice coughing to be sure your lungs are clear. If you have an incision (the cut made at the time of surgery), support your incision when coughing by placing a pillow or rolled up towels firmly against it. Once you are able to get out of bed, walk around indoors and cough well. You may stop using the incentive spirometer when instructed by your caregiver.  RISKS AND COMPLICATIONS Take your time so you do not get dizzy or light-headed. If you are in pain, you may need to take or ask for pain medication before doing incentive spirometry. It is harder to take a deep breath if you are having pain. AFTER USE Rest and breathe slowly and easily. It can be helpful to keep track of a log of your progress. Your caregiver can provide you with a simple table to help with this. If you are using the spirometer at home, follow these instructions: SEEK MEDICAL CARE IF:  You are having difficultly using the spirometer. You have trouble using the spirometer as often as instructed. Your pain medication is not giving enough relief while using the spirometer. You develop fever of 100.5 F (38.1 C) or higher.                                                                                                    SEEK IMMEDIATE MEDICAL CARE IF:  You cough up bloody sputum that had not been present before. You develop fever of 102 F (38.9 C) or greater. You develop worsening pain at or near the incision  site. MAKE SURE YOU:  Understand these instructions. Will watch your condition.  Will get help right away if you are not doing well or get worse. Document Released: 02/10/2007 Document Revised: 12/23/2011 Document Reviewed: 04/13/2007 [facility] Patient Information 2014 [place], [place].     WHAT IS A BLOOD TRANSFUSION? Blood Transfusion Information  A transfusion is the replacement of blood or some of its parts. Blood is made up of multiple cells which provide different functions. Red blood cells carry oxygen and are used for blood loss replacement. White blood cells fight against infection. Platelets control bleeding. Plasma helps clot blood. Other blood products are available for specialized needs, such as hemophilia or other clotting disorders. BEFORE THE TRANSFUSION  Who gives blood for transfusions?  Healthy volunteers who are fully evaluated to make sure their blood is safe. This is blood bank blood. Transfusion therapy is the safest it has ever been in the practice of medicine. Before blood is taken from a donor, a complete history is taken to make sure that person has no history of diseases nor engages in risky social behavior (examples are intravenous drug use or sexual activity with multiple partners). The donor's travel history is screened to minimize risk of transmitting infections, such as malaria. The donated blood is tested for signs of infectious diseases, such as HIV and hepatitis. The blood is then tested to be sure it is compatible with you in order to minimize the chance of a transfusion reaction. If you or a relative donates blood, this is often done in anticipation of surgery and is not appropriate for emergency situations. It takes many days to process the donated blood. RISKS AND COMPLICATIONS Although transfusion therapy is very safe and saves many lives, the main dangers of transfusion include:  Getting an infectious disease. Developing a transfusion reaction. This is  an allergic reaction to something in the blood you were given. Every precaution is taken to prevent this. The decision to have a blood transfusion has been considered carefully by your caregiver before blood is given. Blood is not given unless the benefits outweigh the risks. AFTER THE TRANSFUSION Right after receiving a blood transfusion, you will usually feel much better and more energetic. This is especially true if your red blood cells have gotten low (anemic). The transfusion raises the level of the red blood cells which carry oxygen, and this usually causes an energy increase. The nurse administering the transfusion will monitor you carefully for complications. HOME CARE INSTRUCTIONS  No special instructions are needed after a transfusion. You may find your energy is better. Speak with your caregiver about any limitations on activity for underlying diseases you may have. SEEK MEDICAL CARE IF:  Your condition is not improving after your transfusion. You develop redness or irritation at the intravenous (IV) site. SEEK IMMEDIATE MEDICAL CARE IF:  Any of the following symptoms occur over the next 12 hours: Shaking chills. You have a temperature by mouth above 102 F (38.9 C), not controlled by medicine. Chest, back, or muscle pain. People around you feel you are not acting correctly or are confused. Shortness of breath or difficulty breathing. Dizziness and fainting. You get a rash or develop hives. You have a decrease in urine output. Your urine turns a dark color or changes to pink, red, or brown. Any of the following symptoms occur over the next 10 days: You have a temperature by mouth above 102 F (38.9 C), not controlled by medicine. Shortness of breath. Weakness after normal activity. The white part of the eye turns yellow (jaundice). You have a decrease in  the amount of urine or are urinating less often. Your urine turns a dark color or changes to pink, red, or brown. Document  Released: 09/27/2000 Document Revised: 12/23/2011 Document Reviewed: 05/16/2008 [facility] Patient Information 2014 [place], [place].  _______________________________________________________________________

## 2023-08-17 NOTE — Progress Notes (Signed)
COVID Vaccine received:  []  No [x]  Yes Date of any COVID positive Test in last 90 days:  PCP - Peri Maris, FNP at [facility]-  [phone] Cardiologist - Yates Decamp, MD  Chest x-ray -  EKG -  03-31-2023  Epic Stress Test - 04-03-2023  Epic ECHO - 05-14-2023  Epic Cardiac Cath -   PCR screen: [x]  Ordered & Completed []   No Order but Needs PROFEND     []   N/A for this surgery  Surgery Plan:  []  Ambulatory   [x]  Outpatient in bed  []  Admit Anesthesia:    []  General  []  Spinal  [x]   Choice []   MAC  Pacemaker / ICD device [x]  No []  Yes   Spinal Cord Stimulator:[x]  No []  Yes       History of Sleep Apnea? []  No [x]  Yes   CPAP used?- []  No [x]  Yes    Does the patient monitor blood sugar?   []  N/A   []  No []  Yes  Patient has: []  NO Hx DM   []  Pre-DM   []  DM1  [x]   DM2 Last A1c was: 6.1  on 03-27-2023     Does patient have a [employer] or Dexacom? []  No []  Yes   Fasting Blood Sugar Ranges-  Checks Blood Sugar _____ times a day  GLP1 agonist / usual dose -   OZEMPIC  ?day GLP1 instructions: Hold x 7 days SGLT-2 inhibitors / usual dose - Jardiance  10mg  daily SGLT-2 instructions: Hold x 72 hours  Other Diabetic medications/ instructions: Metformin 500mg  q PM,   Hold DOS  Blood Thinner / Instructions:  none Aspirin Instructions:  none  ERAS Protocol Ordered: []  No  [x]  Yes PRE-SURGERY []  ENSURE  [x]  G2   Patient is to be NPO after: 0415  Dental hx: []  Dentures:  []  N/A      []  Bridge or Partial:                   []  Loose or Damaged teeth:   Comments: Patient was given the 5 CHG shower / bath instructions for THA surgery along with 2 bottles of the CHG soap. Patient will start this on:  Monday 08-25-2023   All questions were asked and answered, Patient voiced understanding of this process.   Activity level:  Can go up a flight of stairs and activities of daily living without stopping and without chest pain and/or shortness of breath. Able to exercise without chest  pain and/or shortness of breath  Anesthesia review: HTN, DM2, smoker, anxiety, heart murmur as child (ECHO 05-14-2023)  Patient denies shortness of breath, fever, cough and chest pain at PAT appointment.  Patient verbalized understanding and agreement to the Pre-Surgical Instructions that were given to them at this PAT appointment. Patient was also educated of the need to review these PAT instructions again prior to his surgery.I reviewed the appropriate phone numbers to call if they have any and questions or concerns.

## 2023-08-18 ENCOUNTER — Encounter (HOSPITAL_COMMUNITY)
Admission: RE | Admit: 2023-08-18 | Discharge: 2023-08-18 | Disposition: A | Discharge status: Home / Self Care | Payer: BC Managed Care – PPO | Source: Ambulatory Visit | Attending: Orthopaedic Surgery | Admitting: Orthopaedic Surgery

## 2023-08-18 ENCOUNTER — Other Ambulatory Visit: Payer: Self-pay

## 2023-08-18 ENCOUNTER — Encounter (HOSPITAL_COMMUNITY): Payer: Self-pay

## 2023-08-18 VITALS — BP 149/87 | HR 100 | Temp 98.2°F | Resp 20 | Ht 70.0 in | Wt 250.0 lb

## 2023-08-18 DIAGNOSIS — E119 Type 2 diabetes mellitus without complications: Secondary | ICD-10-CM

## 2023-08-18 DIAGNOSIS — M1611 Unilateral primary osteoarthritis, right hip: Secondary | ICD-10-CM | POA: Diagnosis not present

## 2023-08-18 DIAGNOSIS — I10 Essential (primary) hypertension: Secondary | ICD-10-CM

## 2023-08-18 DIAGNOSIS — Z01812 Encounter for preprocedural laboratory examination: Secondary | ICD-10-CM | POA: Diagnosis not present

## 2023-08-18 DIAGNOSIS — Z01818 Encounter for other preprocedural examination: Secondary | ICD-10-CM

## 2023-08-18 LAB — TYPE AND SCREEN
ABO/RH(D): O POS
Antibody Screen: NEGATIVE

## 2023-08-18 LAB — COMPREHENSIVE METABOLIC PANEL
ALT: 30 U/L (ref 0–44)
AST: 26 U/L (ref 15–41)
Albumin: 4 g/dL (ref 3.5–5.0)
Alkaline Phosphatase: 55 U/L (ref 38–126)
Anion gap: 13 (ref 5–15)
BUN: 16 mg/dL (ref 6–20)
CO2: 23 mmol/L (ref 22–32)
Calcium: 9.7 mg/dL (ref 8.9–10.3)
Chloride: 103 mmol/L (ref 98–111)
Creatinine, Ser: 0.71 mg/dL (ref 0.61–1.24)
GFR, Estimated: >60 mL/min (ref >60)
Glucose, Bld: 100 mg/dL — ABNORMAL HIGH (ref 70–99)
Potassium: 3.9 mmol/L (ref 3.5–5.1)
Sodium: 139 mmol/L (ref 135–145)
Total Bilirubin: 0.8 mg/dL (ref <1.2)
Total Protein: 7.7 g/dL (ref 6.5–8.1)

## 2023-08-18 LAB — CBC
HCT: 42.4 % (ref 39.0–52.0)
Hemoglobin: 14.4 g/dL (ref 13.0–17.0)
MCH: 35.6 pg — ABNORMAL HIGH (ref 26.0–34.0)
MCHC: 34 g/dL (ref 30.0–36.0)
MCV: 104.7 fL — ABNORMAL HIGH (ref 80.0–100.0)
Platelets: 135 10*3/uL — ABNORMAL LOW (ref 150–400)
RBC: 4.05 MIL/uL — ABNORMAL LOW (ref 4.22–5.81)
RDW: 13.3 % (ref 11.5–15.5)
WBC: 6.5 10*3/uL (ref 4.0–10.5)
nRBC: 0 % (ref 0.0–0.2)

## 2023-08-18 LAB — HEMOGLOBIN A1C
Hgb A1c MFr Bld: 6 % — ABNORMAL HIGH (ref 4.8–5.6)
Mean Plasma Glucose: 125.5 mg/dL

## 2023-08-18 LAB — SURGICAL PCR SCREEN
MRSA, PCR: NEGATIVE
Staphylococcus aureus: NEGATIVE

## 2023-08-18 LAB — GLUCOSE, CAPILLARY: Glucose-Capillary: 114 mg/dL — ABNORMAL HIGH (ref 70–99)

## 2023-08-20 NOTE — Anesthesia Preprocedure Evaluation (Addendum)
Anesthesia Evaluation  Patient identified by MRN, date of birth, ID band Patient awake    Reviewed: Allergy & Precautions, H&P , NPO status , Patient's Chart, lab work & pertinent test results  Airway Mallampati: II   Neck ROM: full    Dental   Pulmonary Current Smoker and Patient abstained from smoking.   breath sounds clear to auscultation       Cardiovascular hypertension,  Rhythm:regular Rate:Normal     Neuro/Psych  PSYCHIATRIC DISORDERS Anxiety Depression       GI/Hepatic   Endo/Other  diabetes, Type 2    Renal/GU stones     Musculoskeletal  (+) Arthritis ,    Abdominal   Peds  Hematology   Anesthesia Other Findings   Reproductive/Obstetrics                             Anesthesia Physical Anesthesia Plan  ASA: 2  Anesthesia Plan: MAC and Spinal   Post-op Pain Management:    Induction: Intravenous  PONV Risk Score and Plan: 0 and Propofol infusion and Treatment may vary due to age or medical condition  Airway Management Planned: Simple Face Mask  Additional Equipment:   Intra-op Plan:   Post-operative Plan:   Informed Consent: I have reviewed the patients History and Physical, chart, labs and discussed the procedure including the risks, benefits and alternatives for the proposed anesthesia with the patient or authorized representative who has indicated his/her understanding and acceptance.     Dental advisory given  Plan Discussed with: CRNA, Anesthesiologist and Surgeon  Anesthesia Plan Comments: (Reviewed 6/13 prior to L THA. No complications. Followed up with Cardiology on 06/25/23. No updates to medical hx since prior surgery.)        Anesthesia Quick Evaluation

## 2023-08-28 DIAGNOSIS — M1611 Unilateral primary osteoarthritis, right hip: Secondary | ICD-10-CM | POA: Insufficient documentation

## 2023-08-28 NOTE — H&P (Signed)
TOTAL HIP ADMISSION H&P  Patient is admitted for right total hip arthroplasty.  Subjective:  Chief Complaint: right hip pain  HPI: [name], 57 y.o. male, has a history of pain and functional disability in the right hip(s) due to arthritis and patient has failed non-surgical conservative treatments for greater than 12 weeks to include NSAID's and/or analgesics, corticosteriod injections, supervised PT with diminished ADL's post treatment, use of assistive devices, weight reduction as appropriate, and activity modification.  Onset of symptoms was gradual starting 3 years ago with gradually worsening course since that time.The patient noted no past surgery on the right hip(s).  Patient currently rates pain in the right hip at 10 out of 10 with activity. Patient has night pain, worsening of pain with activity and weight bearing, trendelenberg gait, pain that interfers with activities of daily living, and pain with passive range of motion. Patient has evidence of subchondral sclerosis, periarticular osteophytes, and joint space narrowing by imaging studies. This condition presents safety issues increasing the risk of falls.  There is no current active infection.  Patient Active Problem List   Diagnosis Date Noted   Unilateral primary osteoarthritis, right hip 08/28/2023   Status post total replacement of left hip 04/04/2023   Right knee pain 08/09/2016   Past Medical History:  Diagnosis Date   Anxiety    Arthritis    Depression    Diabetes mellitus without complication (HCC)    type 2   Heart murmur    childhood heart murmur   High cholesterol    History of kidney stones    Hypertension     Past Surgical History:  Procedure Laterality Date   CYSTOSCOPY WITH URETEROSCOPY AND STENT PLACEMENT Left 05/18/2013   Procedure: LEFT URETEROSCOPY, Retrograde Pyelogram, Left Ureteral Dilation,STONE EXTRACTION, STENT PLACEMENT ;  Surgeon: Anner Crete, MD;  Location: [facility];   Service: Urology;  Laterality: Left;   CYSTOSCOPY/URETEROSCOPY/HOLMIUM LASER/STENT PLACEMENT Right 03/16/2015   Procedure: CYSTOSCOPY/URETEROSCOPY/STONE EXTRACTION/HOLMIUM LASER/STENT PLACEMENT;  Surgeon: Bjorn Pippin, MD;  Location: [facility];  Service: Urology;  Laterality: Right;   EXTRACORPOREAL SHOCK WAVE LITHOTRIPSY Right 02/20/2017   Procedure: RIGHT EXTRACORPOREAL SHOCK WAVE LITHOTRIPSY (ESWL);  Surgeon: Malen Gauze, MD;  Location: [facility];  Service: Urology;  Laterality: Right;   EXTRACORPOREAL SHOCK WAVE LITHOTRIPSY Left 02/25/2019   Procedure: EXTRACORPOREAL SHOCK WAVE LITHOTRIPSY (ESWL);  Surgeon: Alfredo Martinez, MD;  Location: [facility];  Service: Urology;  Laterality: Left;   HOLMIUM LASER APPLICATION Left 05/18/2013   Procedure: HOLMIUM LASER APPLICATION;  Surgeon: Anner Crete, MD;  Location: [facility];  Service: Urology;  Laterality: Left;   KNEE ARTHROSCOPY Right 09/2016   KNEE ARTHROSCOPY Left    reconstruction    KNEE SURGERY     ORIF LEFT PROXIMAL TIBIAL FX  2006   TOTAL HIP ARTHROPLASTY Left 04/04/2023   Procedure: LEFT TOTAL HIP ARTHROPLASTY ANTERIOR APPROACH;  Surgeon: Kathryne Hitch, MD;  Location: [facility];  Service: Orthopedics;  Laterality: Left;  RNFA   VASECTOMY Bilateral 05/18/2013   Procedure: BILATERAL VASECTOMY;  Surgeon: Anner Crete, MD;  Location: [facility];  Service: Urology;  Laterality: Bilateral;    No current facility-administered medications for this encounter.   Current Outpatient Medications  Medication Sig Dispense Refill Last Dose   amLODipine (NORVASC) 5 MG tablet Take 1 tablet (5 mg total) by mouth daily. 90 tablet 3    empagliflozin (JARDIANCE) 10 MG TABS tablet Take 10  mg by mouth daily.      Menthol, Topical Analgesic, (BIOFREEZE EX) Apply 1 Application topically daily as needed (knee pain).      metFORMIN (GLUCOPHAGE-XR) 500 MG 24 hr tablet Take 500 mg by mouth every evening.       metoprolol succinate (TOPROL-XL) 50 MG 24 hr tablet Take 1 tablet (50 mg total) by mouth daily. Take with or immediately following a meal. 90 tablet 3    nicotine (NICODERM CQ) 21 mg/24hr patch Place 1 patch (21 mg total) onto the skin daily. 28 patch 0    olmesartan-hydrochlorothiazide (BENICAR HCT) 20-12.5 MG tablet Take 1 tablet by mouth every morning. 90 tablet 3    OZEMPIC, 0.25 OR 0.5 MG/DOSE, 2 MG/3ML SOPN Inject 0.5 mg into the skin once a week.      rosuvastatin (CRESTOR) 20 MG tablet TAKE 1 TABLET(20 MG) BY MOUTH DAILY 90 tablet 3    sertraline (ZOLOFT) 100 MG tablet Take 150 mg by mouth daily.      tamsulosin (FLOMAX) 0.4 MG CAPS capsule Take 0.4 mg by mouth daily after supper.      empagliflozin (JARDIANCE) 25 MG TABS tablet Take 1 tablet (25 mg total) by mouth daily before breakfast. (Patient not taking: Reported on 08/14/2023) 90 tablet 1 Not Taking   glucose blood test strip 1 each by Other route as needed for other. Use as instructed      sildenafil (VIAGRA) 100 MG tablet Take 100 mg by mouth daily as needed for erectile dysfunction. 1-2 daily prn      No Known Allergies  Social History   Tobacco Use   Smoking status: Every Day    Current packs/day: 1.00    Average packs/day: 1 pack/day for 26.0 years (26.0 ttl pk-yrs)    Types: Cigarettes   Smokeless tobacco: Never   Tobacco comments:    trying to quit 3 cig per day  Substance Use Topics   Alcohol use: Yes    Alcohol/week: 2.0 standard drinks of alcohol    Types: 2 Standard drinks or equivalent per week    Comment: daily    Family History  Problem Relation Age of Onset   Stroke Mother    Hypertension Father    Heart attack Father    Diabetes Father      Review of Systems  Objective:  Physical Exam Vitals reviewed.  Constitutional:      Appearance: Normal appearance. He is obese.  HENT:     Head: Normocephalic and atraumatic.  Eyes:     Extraocular Movements: Extraocular movements intact.      Pupils: Pupils are equal, round, and reactive to light.  Cardiovascular:     Rate and Rhythm: Normal rate and regular rhythm.  Pulmonary:     Effort: Pulmonary effort is normal.     Breath sounds: Normal breath sounds.  Abdominal:     Palpations: Abdomen is soft.  Musculoskeletal:     Cervical back: Normal range of motion and neck supple.     Right hip: Tenderness and bony tenderness present. Decreased range of motion. Decreased strength.  Neurological:     Mental Status: He is alert and oriented to person, place, and time.  Psychiatric:        Behavior: Behavior normal.     Vital signs in last 24 hours:    Labs:   Estimated body mass index is 35.87 kg/m as calculated from the following:   Height as of 08/18/23: 5\' 10"  (1.778 m).  Weight as of 08/18/23: 113.4 kg.   Imaging Review Plain radiographs demonstrate severe degenerative joint disease of the right hip(s). The bone quality appears to be excellent for age and reported activity level.      Assessment/Plan:  End stage arthritis, right hip(s)  The patient history, physical examination, clinical judgement of the provider and imaging studies are consistent with end stage degenerative joint disease of the right hip(s) and total hip arthroplasty is deemed medically necessary. The treatment options including medical management, injection therapy, arthroscopy and arthroplasty were discussed at length. The risks and benefits of total hip arthroplasty were presented and reviewed. The risks due to aseptic loosening, infection, stiffness, dislocation/subluxation,  thromboembolic complications and other imponderables were discussed.  The patient acknowledged the explanation, agreed to proceed with the plan and consent was signed. Patient is being admitted for inpatient treatment for surgery, pain control, PT, OT, prophylactic antibiotics, VTE prophylaxis, progressive ambulation and ADL's and discharge planning.The patient is planning  to be discharged to home after an overnight hospital stay.

## 2023-08-29 ENCOUNTER — Encounter (HOSPITAL_COMMUNITY): Payer: Self-pay | Admitting: Orthopaedic Surgery

## 2023-08-29 ENCOUNTER — Encounter (HOSPITAL_COMMUNITY)
Admission: RE | Disposition: A | Discharge status: Home / Self Care | Payer: Self-pay | Source: Home / Self Care | Attending: Orthopaedic Surgery

## 2023-08-29 ENCOUNTER — Other Ambulatory Visit: Payer: Self-pay

## 2023-08-29 ENCOUNTER — Observation Stay (HOSPITAL_COMMUNITY)
Admission: RE | Admit: 2023-08-29 | Discharge: 2023-08-30 | Disposition: A | Discharge status: Home / Self Care | Payer: BC Managed Care – PPO | Attending: Orthopaedic Surgery | Admitting: Orthopaedic Surgery

## 2023-08-29 ENCOUNTER — Ambulatory Visit (HOSPITAL_COMMUNITY): Payer: BC Managed Care – PPO | Admitting: Physician Assistant

## 2023-08-29 ENCOUNTER — Ambulatory Visit (HOSPITAL_COMMUNITY): Payer: BC Managed Care – PPO | Admitting: Anesthesiology

## 2023-08-29 ENCOUNTER — Ambulatory Visit (HOSPITAL_COMMUNITY): Payer: BC Managed Care – PPO

## 2023-08-29 ENCOUNTER — Observation Stay (HOSPITAL_COMMUNITY): Payer: BC Managed Care – PPO

## 2023-08-29 DIAGNOSIS — Z79899 Other long term (current) drug therapy: Secondary | ICD-10-CM | POA: Diagnosis not present

## 2023-08-29 DIAGNOSIS — F17210 Nicotine dependence, cigarettes, uncomplicated: Secondary | ICD-10-CM | POA: Insufficient documentation

## 2023-08-29 DIAGNOSIS — I10 Essential (primary) hypertension: Secondary | ICD-10-CM | POA: Diagnosis not present

## 2023-08-29 DIAGNOSIS — Z96642 Presence of left artificial hip joint: Secondary | ICD-10-CM | POA: Diagnosis not present

## 2023-08-29 DIAGNOSIS — Z01818 Encounter for other preprocedural examination: Secondary | ICD-10-CM

## 2023-08-29 DIAGNOSIS — E119 Type 2 diabetes mellitus without complications: Secondary | ICD-10-CM | POA: Insufficient documentation

## 2023-08-29 DIAGNOSIS — Z7984 Long term (current) use of oral hypoglycemic drugs: Secondary | ICD-10-CM | POA: Insufficient documentation

## 2023-08-29 DIAGNOSIS — M1611 Unilateral primary osteoarthritis, right hip: Secondary | ICD-10-CM | POA: Diagnosis not present

## 2023-08-29 DIAGNOSIS — Z96643 Presence of artificial hip joint, bilateral: Secondary | ICD-10-CM | POA: Diagnosis not present

## 2023-08-29 DIAGNOSIS — Z471 Aftercare following joint replacement surgery: Secondary | ICD-10-CM | POA: Diagnosis not present

## 2023-08-29 DIAGNOSIS — Z96641 Presence of right artificial hip joint: Secondary | ICD-10-CM

## 2023-08-29 HISTORY — PX: TOTAL HIP ARTHROPLASTY: SHX124

## 2023-08-29 LAB — GLUCOSE, CAPILLARY
Glucose-Capillary: 104 mg/dL — ABNORMAL HIGH (ref 70–99)
Glucose-Capillary: 106 mg/dL — ABNORMAL HIGH (ref 70–99)
Glucose-Capillary: 181 mg/dL — ABNORMAL HIGH (ref 70–99)
Glucose-Capillary: 161 mg/dL — ABNORMAL HIGH (ref 70–99)
Glucose-Capillary: 135 mg/dL — ABNORMAL HIGH (ref 70–99)

## 2023-08-29 SURGERY — ARTHROPLASTY, HIP, TOTAL, ANTERIOR APPROACH
Anesthesia: Monitor Anesthesia Care | Site: Hip | Laterality: Right

## 2023-08-29 MED ORDER — FENTANYL CITRATE (PF) 100 MCG/2ML IJ SOLN
INTRAMUSCULAR | Status: DC | PRN
  Administered 2023-08-29: 100 ug via INTRAVENOUS

## 2023-08-29 MED ORDER — INSULIN ASPART 100 UNIT/ML IJ SOLN: 0.0000 [IU] | Freq: Every day | INTRAMUSCULAR | Status: DC

## 2023-08-29 MED ORDER — METHOCARBAMOL 1000 MG/10ML IJ SOLN: 500.0000 mg | Freq: Four times a day (QID) | INTRAMUSCULAR | Status: DC | PRN

## 2023-08-29 MED ORDER — ONDANSETRON HCL 4 MG/2ML IJ SOLN: 4.0000 mg | Freq: Four times a day (QID) | INTRAMUSCULAR | Status: DC | PRN

## 2023-08-29 MED ORDER — POVIDONE-IODINE 10 % EX SWAB: 2.0000 | Freq: Once | CUTANEOUS | Status: DC

## 2023-08-29 MED ORDER — FENTANYL CITRATE (PF) 100 MCG/2ML IJ SOLN
INTRAMUSCULAR | Status: AC
  Filled 2023-08-29: qty 2

## 2023-08-29 MED ORDER — MIDAZOLAM HCL 5 MG/5ML IJ SOLN
INTRAMUSCULAR | Status: DC | PRN
  Administered 2023-08-29: 2 mg via INTRAVENOUS

## 2023-08-29 MED ORDER — PHENOL 1.4 % MT LIQD: 1.0000 | OROMUCOSAL | Status: DC | PRN

## 2023-08-29 MED ORDER — STERILE WATER FOR IRRIGATION IR SOLN
Status: DC | PRN
  Administered 2023-08-29: 1000 mL

## 2023-08-29 MED ORDER — DEXAMETHASONE SODIUM PHOSPHATE 10 MG/ML IJ SOLN
INTRAMUSCULAR | Status: AC
  Filled 2023-08-29: qty 1

## 2023-08-29 MED ORDER — INSULIN ASPART 100 UNIT/ML IJ SOLN
0.0000 [IU] | INTRAMUSCULAR | Status: DC | PRN
Start: 2023-08-29 — End: 2023-08-29

## 2023-08-29 MED ORDER — CEFAZOLIN SODIUM-DEXTROSE 1-4 GM/50ML-% IV SOLN
1.0000 g | Freq: Four times a day (QID) | INTRAVENOUS | Status: AC
  Administered 2023-08-29 (×2): 1 g via INTRAVENOUS
  Filled 2023-08-29 (×2): qty 50

## 2023-08-29 MED ORDER — METFORMIN HCL ER 500 MG PO TB24
500.0000 mg | ORAL_TABLET | Freq: Every evening | ORAL | Status: DC
  Administered 2023-08-29: 500 mg via ORAL
  Filled 2023-08-29: qty 1

## 2023-08-29 MED ORDER — METOCLOPRAMIDE HCL 5 MG PO TABS: 5.0000 mg | ORAL_TABLET | Freq: Three times a day (TID) | ORAL | Status: DC | PRN

## 2023-08-29 MED ORDER — PROPOFOL 10 MG/ML IV BOLUS
INTRAVENOUS | Status: DC | PRN
  Administered 2023-08-29 (×2): 10 mg via INTRAVENOUS

## 2023-08-29 MED ORDER — 0.9 % SODIUM CHLORIDE (POUR BTL) OPTIME
TOPICAL | Status: DC | PRN
  Administered 2023-08-29: 1000 mL

## 2023-08-29 MED ORDER — HYDROCHLOROTHIAZIDE 12.5 MG PO TABS
12.5000 mg | ORAL_TABLET | Freq: Every day | ORAL | Status: DC
  Administered 2023-08-29 – 2023-08-30 (×2): 12.5 mg via ORAL
  Filled 2023-08-29 (×2): qty 1

## 2023-08-29 MED ORDER — ACETAMINOPHEN 325 MG PO TABS
325.0000 mg | ORAL_TABLET | Freq: Four times a day (QID) | ORAL | Status: DC | PRN
  Administered 2023-08-29: 325 mg via ORAL
  Filled 2023-08-29: qty 2

## 2023-08-29 MED ORDER — AMLODIPINE BESYLATE 5 MG PO TABS
5.0000 mg | ORAL_TABLET | Freq: Every day | ORAL | Status: DC
  Administered 2023-08-29 – 2023-08-30 (×2): 5 mg via ORAL
  Filled 2023-08-29 (×2): qty 1

## 2023-08-29 MED ORDER — METOPROLOL SUCCINATE ER 50 MG PO TB24
50.0000 mg | ORAL_TABLET | Freq: Every day | ORAL | Status: DC
  Administered 2023-08-30: 50 mg via ORAL
  Filled 2023-08-29: qty 1

## 2023-08-29 MED ORDER — ONDANSETRON HCL 4 MG/2ML IJ SOLN
INTRAMUSCULAR | Status: DC | PRN
  Administered 2023-08-29: 4 mg via INTRAVENOUS

## 2023-08-29 MED ORDER — PANTOPRAZOLE SODIUM 40 MG PO TBEC
40.0000 mg | DELAYED_RELEASE_TABLET | Freq: Every day | ORAL | Status: DC
  Administered 2023-08-30: 40 mg via ORAL
  Filled 2023-08-29: qty 1

## 2023-08-29 MED ORDER — CEFAZOLIN SODIUM-DEXTROSE 2-4 GM/100ML-% IV SOLN
2.0000 g | INTRAVENOUS | Status: AC
  Administered 2023-08-29: 2 g via INTRAVENOUS
  Filled 2023-08-29: qty 100

## 2023-08-29 MED ORDER — SERTRALINE HCL 50 MG PO TABS
150.0000 mg | ORAL_TABLET | Freq: Every day | ORAL | Status: DC
  Administered 2023-08-29 – 2023-08-30 (×2): 150 mg via ORAL
  Filled 2023-08-29 (×2): qty 1

## 2023-08-29 MED ORDER — DOCUSATE SODIUM 100 MG PO CAPS
100.0000 mg | ORAL_CAPSULE | Freq: Two times a day (BID) | ORAL | Status: DC
Start: 2023-08-29 — End: 2023-08-30
  Administered 2023-08-29 – 2023-08-30 (×2): 100 mg via ORAL
  Filled 2023-08-29 (×3): qty 1

## 2023-08-29 MED ORDER — PROPOFOL 500 MG/50ML IV EMUL
INTRAVENOUS | Status: DC | PRN
  Administered 2023-08-29: 100 ug/kg/min via INTRAVENOUS

## 2023-08-29 MED ORDER — ONDANSETRON HCL 4 MG PO TABS: 4.0000 mg | ORAL_TABLET | Freq: Four times a day (QID) | ORAL | Status: DC | PRN

## 2023-08-29 MED ORDER — ORAL CARE MOUTH RINSE: 15.0000 mL | Freq: Once | OROMUCOSAL | Status: AC

## 2023-08-29 MED ORDER — CHLORHEXIDINE GLUCONATE 0.12 % MT SOLN
15.0000 mL | Freq: Once | OROMUCOSAL | Status: AC
  Administered 2023-08-29: 15 mL via OROMUCOSAL

## 2023-08-29 MED ORDER — POLYETHYLENE GLYCOL 3350 17 G PO PACK: 17.0000 g | PACK | Freq: Every day | ORAL | Status: DC | PRN

## 2023-08-29 MED ORDER — ALUM & MAG HYDROXIDE-SIMETH 200-200-20 MG/5ML PO SUSP: 30.0000 mL | ORAL | Status: DC | PRN

## 2023-08-29 MED ORDER — OXYCODONE HCL 5 MG PO TABS: 5.0000 mg | ORAL_TABLET | Freq: Once | ORAL | Status: DC | PRN

## 2023-08-29 MED ORDER — OXYCODONE HCL 5 MG PO TABS
5.0000 mg | ORAL_TABLET | ORAL | Status: DC | PRN
  Administered 2023-08-30: 5 mg via ORAL
  Administered 2023-08-30: 10 mg via ORAL
  Filled 2023-08-29: qty 2
  Filled 2023-08-29: qty 1
  Filled 2023-08-29: qty 2

## 2023-08-29 MED ORDER — FENTANYL CITRATE PF 50 MCG/ML IJ SOSY: 25.0000 ug | PREFILLED_SYRINGE | INTRAMUSCULAR | Status: DC | PRN

## 2023-08-29 MED ORDER — HYDROMORPHONE HCL 1 MG/ML IJ SOLN: 0.5000 mg | INTRAMUSCULAR | Status: DC | PRN

## 2023-08-29 MED ORDER — OXYCODONE HCL 5 MG/5ML PO SOLN
5.0000 mg | Freq: Once | ORAL | Status: DC | PRN
Start: 2023-08-29 — End: 2023-08-29

## 2023-08-29 MED ORDER — LACTATED RINGERS IV SOLN: INTRAVENOUS | Status: DC

## 2023-08-29 MED ORDER — OLMESARTAN MEDOXOMIL-HCTZ 20-12.5 MG PO TABS: 1.0000 | ORAL_TABLET | ORAL | Status: DC

## 2023-08-29 MED ORDER — PROPOFOL 1000 MG/100ML IV EMUL
INTRAVENOUS | Status: AC
  Filled 2023-08-29: qty 100

## 2023-08-29 MED ORDER — BUPIVACAINE IN DEXTROSE 0.75-8.25 % IT SOLN
INTRATHECAL | Status: DC | PRN
  Administered 2023-08-29: 1.8 mL via INTRATHECAL

## 2023-08-29 MED ORDER — DEXAMETHASONE SODIUM PHOSPHATE 10 MG/ML IJ SOLN
INTRAMUSCULAR | Status: DC | PRN
  Administered 2023-08-29: 8 mg via INTRAVENOUS

## 2023-08-29 MED ORDER — PHENYLEPHRINE HCL-NACL 20-0.9 MG/250ML-% IV SOLN
INTRAVENOUS | Status: AC
  Filled 2023-08-29: qty 250

## 2023-08-29 MED ORDER — INSULIN ASPART 100 UNIT/ML IJ SOLN: 0.0000 [IU] | Freq: Three times a day (TID) | INTRAMUSCULAR | Status: DC

## 2023-08-29 MED ORDER — EMPAGLIFLOZIN 10 MG PO TABS
10.0000 mg | ORAL_TABLET | Freq: Every day | ORAL | Status: DC
  Administered 2023-08-29 – 2023-08-30 (×2): 10 mg via ORAL
  Filled 2023-08-29 (×2): qty 1

## 2023-08-29 MED ORDER — METOPROLOL SUCCINATE ER 25 MG PO TB24
50.0000 mg | ORAL_TABLET | Freq: Every day | ORAL | Status: DC
  Administered 2023-08-29: 50 mg via ORAL
  Filled 2023-08-29: qty 2

## 2023-08-29 MED ORDER — TRANEXAMIC ACID-NACL 1000-0.7 MG/100ML-% IV SOLN
1000.0000 mg | INTRAVENOUS | Status: AC
  Administered 2023-08-29: 1000 mg via INTRAVENOUS
  Filled 2023-08-29: qty 100

## 2023-08-29 MED ORDER — MIDAZOLAM HCL 2 MG/2ML IJ SOLN
INTRAMUSCULAR | Status: AC
  Filled 2023-08-29: qty 2

## 2023-08-29 MED ORDER — SODIUM CHLORIDE 0.9 % IV SOLN: INTRAVENOUS | Status: DC

## 2023-08-29 MED ORDER — METHOCARBAMOL 500 MG PO TABS
500.0000 mg | ORAL_TABLET | Freq: Four times a day (QID) | ORAL | Status: DC | PRN
  Administered 2023-08-29 – 2023-08-30 (×2): 500 mg via ORAL
  Filled 2023-08-29 (×2): qty 1

## 2023-08-29 MED ORDER — IRBESARTAN 150 MG PO TABS
150.0000 mg | ORAL_TABLET | Freq: Every day | ORAL | Status: DC
Start: 2023-08-29 — End: 2023-08-30
  Administered 2023-08-29 – 2023-08-30 (×2): 150 mg via ORAL
  Filled 2023-08-29 (×2): qty 1

## 2023-08-29 MED ORDER — METOCLOPRAMIDE HCL 5 MG/ML IJ SOLN: 5.0000 mg | Freq: Three times a day (TID) | INTRAMUSCULAR | Status: DC | PRN

## 2023-08-29 MED ORDER — OXYCODONE HCL 5 MG PO TABS
10.0000 mg | ORAL_TABLET | ORAL | Status: DC | PRN
  Administered 2023-08-29: 10 mg via ORAL

## 2023-08-29 MED ORDER — MENTHOL 3 MG MT LOZG: 1.0000 | LOZENGE | OROMUCOSAL | Status: DC | PRN

## 2023-08-29 MED ORDER — ONDANSETRON HCL 4 MG/2ML IJ SOLN
INTRAMUSCULAR | Status: AC
  Filled 2023-08-29: qty 2

## 2023-08-29 MED ORDER — TAMSULOSIN HCL 0.4 MG PO CAPS
0.4000 mg | ORAL_CAPSULE | Freq: Every day | ORAL | Status: DC
  Administered 2023-08-29: 0.4 mg via ORAL
  Filled 2023-08-29: qty 1

## 2023-08-29 MED ORDER — ASPIRIN 81 MG PO CHEW
81.0000 mg | CHEWABLE_TABLET | Freq: Two times a day (BID) | ORAL | Status: DC
  Administered 2023-08-29 – 2023-08-30 (×2): 81 mg via ORAL
  Filled 2023-08-29 (×2): qty 1

## 2023-08-29 SURGICAL SUPPLY — 39 items
BAG COUNTER SPONGE SURGICOUNT (BAG) ×1 IMPLANT
BAG ZIPLOCK 12X15 (MISCELLANEOUS) IMPLANT
BENZOIN TINCTURE PRP APPL 2/3 (GAUZE/BANDAGES/DRESSINGS) IMPLANT
BLADE SAW SGTL 18X1.27X75 (BLADE) ×1 IMPLANT
COVER PERINEAL POST (MISCELLANEOUS) ×1 IMPLANT
COVER SURGICAL LIGHT HANDLE (MISCELLANEOUS) ×1 IMPLANT
DRAPE FOOT SWITCH (DRAPES) ×1 IMPLANT
DRAPE STERI IOBAN 125X83 (DRAPES) ×1 IMPLANT
DRAPE U-SHAPE 47X51 STRL (DRAPES) ×2 IMPLANT
DRSG AQUACEL AG ADV 3.5X10 (GAUZE/BANDAGES/DRESSINGS) ×1 IMPLANT
DURAPREP 26ML APPLICATOR (WOUND CARE) ×1 IMPLANT
ELECT REM PT RETURN 15FT ADLT (MISCELLANEOUS) ×1 IMPLANT
GAUZE XEROFORM 1X8 LF (GAUZE/BANDAGES/DRESSINGS) IMPLANT
GLOVE BIO SURGEON STRL SZ7.5 (GLOVE) ×1 IMPLANT
GLOVE BIOGEL PI IND STRL 8 (GLOVE) ×2 IMPLANT
GLOVE ECLIPSE 8.0 STRL XLNG CF (GLOVE) ×1 IMPLANT
GOWN STRL REUS W/ TWL XL LVL3 (GOWN DISPOSABLE) ×2 IMPLANT
GOWN STRL REUS W/TWL XL LVL3 (GOWN DISPOSABLE) ×2
HANDPIECE INTERPULSE COAX TIP (DISPOSABLE) ×1
HEAD CERAMIC 36 PLUS5 (Hips) IMPLANT
HOLDER FOLEY CATH W/STRAP (MISCELLANEOUS) ×1 IMPLANT
KIT TURNOVER KIT A (KITS) IMPLANT
LINER ACETAB NEUTRAL 36ID 520D (Liner) IMPLANT
PACK ANTERIOR HIP CUSTOM (KITS) ×1 IMPLANT
PIN SECTOR W/GRIP ACE CUP 52MM (Hips) IMPLANT
SET HNDPC FAN SPRY TIP SCT (DISPOSABLE) ×1 IMPLANT
STAPLER SKIN PROX WIDE 3.9 (STAPLE) IMPLANT
STAPLER VISISTAT 35W (STAPLE) IMPLANT
STEM FEMORAL SZ6 HIGH ACTIS (Stem) IMPLANT
STRIP CLOSURE SKIN 1/2X4 (GAUZE/BANDAGES/DRESSINGS) IMPLANT
SUT ETHIBOND NAB CT1 #1 30IN (SUTURE) ×1 IMPLANT
SUT ETHILON 2 0 PS N (SUTURE) IMPLANT
SUT MNCRL AB 4-0 PS2 18 (SUTURE) IMPLANT
SUT VIC AB 0 CT1 36 (SUTURE) ×1 IMPLANT
SUT VIC AB 1 CT1 36 (SUTURE) ×1 IMPLANT
SUT VIC AB 2-0 CT1 27 (SUTURE) ×2
SUT VIC AB 2-0 CT1 TAPERPNT 27 (SUTURE) ×2 IMPLANT
TRAY FOLEY MTR SLVR 16FR STAT (SET/KITS/TRAYS/PACK) IMPLANT
YANKAUER SUCT BULB TIP NO VENT (SUCTIONS) ×1 IMPLANT

## 2023-08-29 NOTE — Transfer of Care (Signed)
Immediate Anesthesia Transfer of Care Note  Patient: [name]  Procedure(s) Performed: RIGHT TOTAL HIP ARTHROPLASTY ANTERIOR APPROACH (Right: Hip)  Patient Location: PACU  Anesthesia Type:Spinal  Level of Consciousness: sedated  Airway & Oxygen Therapy: Patient Spontanous Breathing and Patient connected to face mask oxygen  Post-op Assessment: Report given to RN and Post -op Vital signs reviewed and stable  Post vital signs: Reviewed and stable  Last Vitals:  Vitals Value Taken Time  BP    Temp    Pulse 69 08/29/23 0906  Resp 10 08/29/23 0906  SpO2 95 % 08/29/23 0906  Vitals shown include unfiled device data.  Last Pain:  Vitals:   08/29/23 0622  TempSrc: Oral  PainSc:          Complications: No notable events documented.

## 2023-08-29 NOTE — Anesthesia Procedure Notes (Signed)
Spinal  Patient location during procedure: OR Start time: 08/29/2023 7:22 AM End time: 08/29/2023 7:24 AM Reason for block: surgical anesthesia Staffing Performed: resident/CRNA  Anesthesiologist: Achille Rich, MD Resident/CRNA: Doran Clay, CRNA Performed by: Doran Clay, CRNA Authorized by: Achille Rich, MD   Preanesthetic Checklist Completed: patient identified, IV checked, site marked, risks and benefits discussed, surgical consent, monitors and equipment checked, pre-op evaluation and timeout performed Spinal Block Patient position: sitting Prep: DuraPrep Patient monitoring: heart rate, cardiac monitor, continuous pulse ox and blood pressure Approach: midline Location: L3-4 Needle Needle type: Pencan  Needle gauge: 24 G Needle length: 10 cm Needle insertion depth: 7 cm Assessment Sensory level: T6 Events: CSF return Additional Notes Timeout performed. Patient in sitting position. L3-4 identified. Cleansed with Duraprep. SAB without difficulty. To supine position.

## 2023-08-29 NOTE — Evaluation (Signed)
Physical Therapy Evaluation Patient Details Name: [name] MRN: [id number] DOB: 1966/07/17 Today's Date: 08/29/2023  History of Present Illness  Pt s/p R THR and with hx of L THR and DM  Clinical Impression  Pt s/p R THR and presents with decreased R LE strength/ROM and post op pain limiting functional mobility.  Pt should progress well to dc home with family assist.      If plan is discharge home, recommend the following:     Can travel by private vehicle        Equipment Recommendations None recommended by PT  Recommendations for Other Services       Functional Status Assessment Patient has had a recent decline in their functional status and demonstrates the ability to make significant improvements in function in a reasonable and predictable amount of time.     Precautions / Restrictions Precautions Precautions: Fall Restrictions Weight Bearing Restrictions: No Other Position/Activity Restrictions: WBAT      Mobility  Bed Mobility Overal bed mobility: Needs Assistance Bed Mobility: Supine to Sit     Supine to sit: Supervision     General bed mobility comments: use of bed rail but no physical assist    Transfers Overall transfer level: Needs assistance Equipment used: Rolling walker (2 wheels) Transfers: Sit to/from Stand Sit to Stand: Contact guard assist           General transfer comment: cues for LE management and use of UEs to self assist    Ambulation/Gait Ambulation/Gait assistance: Contact guard assist Gait Distance (Feet): 160 Feet Assistive device: Rolling walker (2 wheels) Gait Pattern/deviations: Step-to pattern, Step-through pattern, Decreased step length - left, Decreased step length - right, Shuffle       General Gait Details: min cues for posture and position from [employer]            Wheelchair Mobility     Tilt Bed    Modified Rankin (Stroke Patients Only)       Balance Overall balance assessment: Mild  deficits observed, not formally tested                                           Pertinent Vitals/Pain Pain Assessment Pain Assessment: 0-10 Pain Score: 4  Pain Location: R hip Pain Descriptors / Indicators: Aching, Sore Pain Intervention(s): Limited activity within patient's tolerance, Monitored during session, Premedicated before session, Ice applied    Home Living Family/patient expects to be discharged to:: Private residence Living Arrangements: Spouse/significant other Available Help at Discharge: Family Type of Home: House Home Access: Stairs to enter Entrance Stairs-Rails: None Entrance Stairs-Number of Steps: 1+1+1   Home Layout: One level Home Equipment: Agricultural consultant (2 wheels);Cane - single point      Prior Function Prior Level of Function : Independent/Modified Independent             Mobility Comments: IND with all ADLs, IADLs, driving, working       Extremity/Trunk Assessment   Upper Extremity Assessment Upper Extremity Assessment: Overall WFL for tasks assessed    Lower Extremity Assessment Lower Extremity Assessment: RLE deficits/detail    Cervical / Trunk Assessment Cervical / Trunk Assessment: Normal  Communication   Communication Communication: No apparent difficulties  Cognition Arousal: Alert Behavior During Therapy: WFL for tasks assessed/performed Overall Cognitive Status: Within Functional Limits for tasks assessed  General Comments      Exercises General Exercises - Lower Extremity Ankle Circles/Pumps: AROM, Both, 15 reps, Supine   Assessment/Plan    PT Assessment Patient needs continued PT services  PT Problem List Decreased strength;Decreased range of motion;Decreased activity tolerance;Decreased balance;Decreased mobility;Decreased knowledge of use of DME;Pain       PT Treatment Interventions DME instruction;Gait training;Stair  training;Functional mobility training;Therapeutic activities;Therapeutic exercise;Patient/family education    PT Goals (Current goals can be found in the Care Plan section)  Acute Rehab PT Goals Patient Stated Goal: Regain IND PT Goal Formulation: With patient Time For Goal Achievement: 09/05/23 Potential to Achieve Goals: Good    Frequency 7X/week     Co-evaluation               AM-PAC PT "6 Clicks" Mobility  Outcome Measure Help needed turning from your back to your side while in a flat bed without using bedrails?: A Little Help needed moving from lying on your back to sitting on the side of a flat bed without using bedrails?: A Little Help needed moving to and from a bed to a chair (including a wheelchair)?: A Little Help needed standing up from a chair using your arms (e.g., wheelchair or bedside chair)?: A Little Help needed to walk in hospital room?: A Little Help needed climbing 3-5 steps with a railing? : A Little 6 Click Score: 18    End of Session Equipment Utilized During Treatment: Gait belt Activity Tolerance: Patient tolerated treatment well Patient left: in chair;with call bell/phone within reach;with family/visitor present Nurse Communication: Mobility status PT Visit Diagnosis: Difficulty in walking, not elsewhere classified (R26.2)    Time: 1440-1456 PT Time Calculation (min) (ACUTE ONLY): 16 min   Charges:   PT Evaluation $PT Eval Low Complexity: 1 Low   PT General Charges $$ ACUTE PT VISIT: 1 Visit         Mauro Kaufmann PT Acute Rehabilitation Services Pager [phone] Office [phone]   Amanat Hackel 08/29/2023, 4:05 PM

## 2023-08-29 NOTE — Op Note (Signed)
Operative Note  Date of operation: 08/29/2023 Preoperative diagnosis: Right hip primary osteoarthritis Postoperative diagnosis: Same  Procedure: Right direct anterior total hip arthroplasty  Implants: Implant Name Type Inv. Item Serial No. Manufacturer Lot No. LRB No. Used Action  PIN SECTOR W/GRIP ACE CUP - [id number] Hips PIN SECTOR W/GRIP ACE CUP  [employer] [id number] Right 1 Implanted  LINER ACETAB NEUTRAL 36ID 520D - [id number] Liner LINER ACETAB NEUTRAL 36ID 520D  [employer]PU[employer] [id number] Right 1 Implanted  STEM FEMORAL SZ6 HIGH ACTIS - [id number] Stem STEM FEMORAL SZ6 HIGH ACTIS  [employer]PU[employer]TH[employer] [id number] Right 1 Implanted  HEAD CERAMIC 36 PLUS5 - [id number] Hips HEAD CERAMIC 36 PLUS5  [employer] [id number] Right 1 Implanted   Surgeon: Vanita Panda. Magnus Ivan, MD Assistant: Rexene Edison, PA-C  Physician observer: Thane Edu, MD  Anesthesia: Spinal EBL: 250 cc Antibiotics: 2 g IV Ancef Complications: None  Indications: The patient is a 57 year old gentleman with debilitating arthritis involving his right hip.  We actually replaced his left hip and just June of this year secondary to severe arthritis.  At this point he has done very well with his left hip.  His right hip has well-documented severe and stage arthritis.  His pain is daily with the right hip and it is detrimentally fighting his mobility, his quality of life and affects his daily living to the point he does wish proceed with a total hip arthroplasty on the right side.  Having had this before he is fully aware of the risks of acute blood loss anemia, nerve vessel injury, fracture, infection, dislocation, DVT, implant failure, leg length differences and wound healing issues.  He understands that our goals are hopefully decrease pain, improve mobility, and improve quality of life.  Procedure description: After informed consent was obtained and the appropriate right hip was marked, the patient was  brought to the operating room and set up on the stretcher where spinal anesthesia was obtained.  He was then laid in supine position on the stretcher and a Foley catheter is placed.  Assess his leg lengths and placed traction boots on both his feet.  Next he was placed supine on the Hana fracture table with a perineal post in place in both legs and inline skeletal traction devices but no traction applied.  His right operative hip and pelvis were assessed radiographically.  The right hip was then prepped and draped in DuraPrep and sterile drapes.  A timeout was called and he was identified as the correct patient the correct right hip.  An incision was then made just inferior and posterior to the ASIS and carried slightly oblique down the leg.  Dissection was carried down to the tensor fascia lata muscle and the tensor fascia was divided longitudinally to proceed with a direct anterior process of the hip.  Circumflex vessels were identified and cauterized.  The hip capsule was identified and opened up in L-type format.  Cobra retractors were placed around the medial and lateral femoral neck and a femoral neck cut was made with an oscillating saw just proximal to the lesser trochanter.  This cut was completed with an osteotome.  A corkscrew guide was placed in the femoral head and the femoral head was removed its entirety.  There was a wide area devoid of cartilage.  A bent Hohmann was then placed over the medial acetabular rim and remnants of the acetabular labrum and other debris removed.  Reaming was then initiated from a size 43 reamer and stepwise  increments going up to a size 51 reamer with the reamers placed under direct visualization and the last reamer also placed under direct fluoroscopy in order to obtain the depth of reaming, the inclination and anteversion.  We then placed the real DePuy sector GRIPTION acetabular opponent size 52 without difficulty followed by 36+0 polyethylene liner.  Attention was then  turned to the femur.  With the right leg externally rotated to 130 degrees, extended and adducted, a Mueller retractor was placed medially and a Hohmann retractor behind the greater trochanter.  The lateral joint capsule was released and a box cutting osteotome was used in her femoral canal.  Broaching was then initiated using the Actis broaching system from a size 0 and stepwise increments going up to a size 6.  We then trialed a high offset femoral neck and went with a 36-2 trial hip ball.  The right leg was brought over and up and with traction and internal rotation reduced in the pelvis.  It was stable on exam but we felt like based on radiographic assessment and clinical assessment we needed more leg length and slightly more offset.  We dislocated the hip and removed the trial components.  We placed the real Actis femoral component with high offset size 6 and 1 with the real 36+5 ceramic head ball.  Again this was reduced in the pelvis and I was pleased with offset, leg length, range of motion and stability and this was assessed both radiographically and mechanically.  The soft tissue was then irrigated normal saline solution.  Remnants of the joint capsule were closed with interrupted #1 Ethibond suture followed by #1 Vicryl to close the tensor fascia.  0 Vicryl was used to close the deep tissue and 2-0 Vicryl was used to close subcutaneous tissue.  The skin was reapproximated staples.  An Aquacel dressing was applied.  The patient was taken off the Hana table and taken to recovery room in stable condition.  Rexene Edison, PA-C did assist in interrogation.  In the end and his assistance was medically necessary and efficient for soft tissue management helping guide implant placement and a layered closure of the wound.

## 2023-08-29 NOTE — Interval H&P Note (Signed)
History and Physical Interval Note: The patient understands that he is here today for a right total hip replacement to treat his severe right hip arthritis.  There has been no acute or interval change in his medical status.  The risks and benefits of surgery have been discussed in detail and informed consent has been obtained.  The right operative hip has been marked.  08/29/2023 6:55 AM  [name]  has presented today for surgery, with the diagnosis of Right Hip Osteoarthritis.  The various methods of treatment have been discussed with the patient and family. After consideration of risks, benefits and other options for treatment, the patient has consented to  Procedure(s): RIGHT TOTAL HIP ARTHROPLASTY ANTERIOR APPROACH (Right) as a surgical intervention.  The patient's history has been reviewed, patient examined, no change in status, stable for surgery.  I have reviewed the patient's chart and labs.  Questions were answered to the patient's satisfaction.     Kathryne Hitch

## 2023-08-29 NOTE — TOC Transition Note (Signed)
Transition of Care [facility]) - CM/SW Discharge Note   Patient Details  Name: [name] MRN: [id number] Date of Birth: 04-23-1966  [facility] CM/SW Contact:  Amada Jupiter, LCSW Phone Number: 08/29/2023, 2:54 PM   Clinical Narrative:     Met with pt and spouse who confirm he has needed DME in the home.  Pt aware HHPT prearranged again with [facility] via ortho MD office prior to surgery.  No further TOC needs.  Final next level of care: Home w Home Health Services Barriers to Discharge: No Barriers Identified   Patient Goals and CMS Choice      Discharge Placement                         Discharge Plan and Services Additional resources added to the After Visit Summary for                  DME Arranged: N/A DME Agency: NA       HH Arranged: PT HH Agency: [facility]        Social Determinants of Health (SDOH) Interventions SDOH Screenings   Food Insecurity: No Food Insecurity (04/04/2023)  Housing: Patient Declined (08/29/2023)  Transportation Needs: No Transportation Needs (08/29/2023)  Utilities: Not At Risk (08/29/2023)  Tobacco Use: High Risk (08/29/2023)     Readmission Risk Interventions     No data to display

## 2023-08-30 DIAGNOSIS — E119 Type 2 diabetes mellitus without complications: Secondary | ICD-10-CM | POA: Diagnosis not present

## 2023-08-30 DIAGNOSIS — Z96642 Presence of left artificial hip joint: Secondary | ICD-10-CM | POA: Diagnosis not present

## 2023-08-30 DIAGNOSIS — F17210 Nicotine dependence, cigarettes, uncomplicated: Secondary | ICD-10-CM | POA: Diagnosis not present

## 2023-08-30 DIAGNOSIS — I10 Essential (primary) hypertension: Secondary | ICD-10-CM | POA: Diagnosis not present

## 2023-08-30 DIAGNOSIS — Z7984 Long term (current) use of oral hypoglycemic drugs: Secondary | ICD-10-CM | POA: Diagnosis not present

## 2023-08-30 DIAGNOSIS — Z79899 Other long term (current) drug therapy: Secondary | ICD-10-CM | POA: Diagnosis not present

## 2023-08-30 DIAGNOSIS — M1611 Unilateral primary osteoarthritis, right hip: Secondary | ICD-10-CM | POA: Diagnosis not present

## 2023-08-30 LAB — BASIC METABOLIC PANEL
Anion gap: 8 (ref 5–15)
BUN: 13 mg/dL (ref 6–20)
CO2: 23 mmol/L (ref 22–32)
Calcium: 8.7 mg/dL — ABNORMAL LOW (ref 8.9–10.3)
Chloride: 101 mmol/L (ref 98–111)
Creatinine, Ser: 0.78 mg/dL (ref 0.61–1.24)
GFR, Estimated: >60 mL/min (ref >60)
Glucose, Bld: 119 mg/dL — ABNORMAL HIGH (ref 70–99)
Potassium: 3.5 mmol/L (ref 3.5–5.1)
Sodium: 132 mmol/L — ABNORMAL LOW (ref 135–145)

## 2023-08-30 LAB — CBC
HCT: 38.1 % — ABNORMAL LOW (ref 39.0–52.0)
Hemoglobin: 12.7 g/dL — ABNORMAL LOW (ref 13.0–17.0)
MCH: 35.7 pg — ABNORMAL HIGH (ref 26.0–34.0)
MCHC: 33.3 g/dL (ref 30.0–36.0)
MCV: 107 fL — ABNORMAL HIGH (ref 80.0–100.0)
Platelets: 147 10*3/uL — ABNORMAL LOW (ref 150–400)
RBC: 3.56 MIL/uL — ABNORMAL LOW (ref 4.22–5.81)
RDW: 13.6 % (ref 11.5–15.5)
WBC: 7.8 10*3/uL (ref 4.0–10.5)
nRBC: 0 % (ref 0.0–0.2)

## 2023-08-30 LAB — GLUCOSE, CAPILLARY: Glucose-Capillary: 112 mg/dL — ABNORMAL HIGH (ref 70–99)

## 2023-08-30 MED ORDER — OXYCODONE HCL 5 MG PO TABS: 5.0000 mg | ORAL_TABLET | Freq: Four times a day (QID) | ORAL | 0 refills | Status: DC | PRN

## 2023-08-30 MED ORDER — ASPIRIN 81 MG PO CHEW: 81.0000 mg | CHEWABLE_TABLET | Freq: Two times a day (BID) | ORAL | 0 refills | Status: DC

## 2023-08-30 MED ORDER — METHOCARBAMOL 500 MG PO TABS: 500.0000 mg | ORAL_TABLET | Freq: Four times a day (QID) | ORAL | 0 refills | Status: DC | PRN

## 2023-08-30 NOTE — Progress Notes (Addendum)
The patient is stable and mobilizing well with walker. No change from am assessment. Incision site is clean, dry and intact. Dorsalis pedal pulses 2+ bilaterally. Discharge instructions were reviewed with the patient and his spouse. Questions, concerns were denied at this time.

## 2023-08-30 NOTE — Progress Notes (Signed)
Subjective: 1 Day Post-Op Procedure(s) (LRB): RIGHT TOTAL HIP ARTHROPLASTY ANTERIOR APPROACH (Right) Patient reports pain as moderate.    Objective: Vital signs in last 24 hours: Temp:  [98.1 F (36.7 C)-99.8 F (37.7 C)] 98.8 F (37.1 C) (11/16 1013) Pulse Rate:  [80-95] 82 (11/16 1013) Resp:  [16-18] 18 (11/16 1013) BP: (117-145)/(84-96) 138/96 (11/16 1013) SpO2:  [95 %-97 %] 97 % (11/16 1013)  Intake/Output from previous day: 11/15 0701 - 11/16 0700 In: 1933.5 [P.O.:240; I.V.:1643.5; IV Piggyback:50] Out: 2825 [Urine:2575; Blood:250] Intake/Output this shift: No intake/output data recorded.  Recent Labs    08/30/23 0255  HGB 12.7*   Recent Labs    08/30/23 0255  WBC 7.8  RBC 3.56*  HCT 38.1*  PLT 147*   Recent Labs    08/30/23 0255  NA 132*  K 3.5  CL 101  CO2 23  BUN 13  CREATININE 0.78  GLUCOSE 119*  CALCIUM 8.7*   No results for input(s): "LABPT", "INR" in the last 72 hours.  Sensation intact distally Intact pulses distally Dorsiflexion/Plantar flexion intact Incision: dressing C/D/I   Assessment/Plan: 1 Day Post-Op Procedure(s) (LRB): RIGHT TOTAL HIP ARTHROPLASTY ANTERIOR APPROACH (Right) Up with therapy Discharge home with home health      Kathryne Hitch 08/30/2023, 10:41 AM

## 2023-08-30 NOTE — Progress Notes (Signed)
Physical Therapy Treatment Patient Details Name: [name] MRN: [id number] DOB: 1966-02-15 Today's Date: 08/30/2023   History of Present Illness Pt s/p R THR and with hx of L THR and DM    PT Comments  Pt motivated and progressing well including up to ambulate increased distance in hall, negotiated step, reviewed car transfers and HEP initiated.  Pt eager for dc home this date.    If plan is discharge home, recommend the following:     Can travel by private vehicle        Equipment Recommendations  None recommended by PT    Recommendations for Other Services       Precautions / Restrictions Precautions Precautions: Fall Restrictions Weight Bearing Restrictions: No Other Position/Activity Restrictions: WBAT     Mobility  Bed Mobility               General bed mobility comments: UP in chair and requests back to same    Transfers Overall transfer level: Needs assistance Equipment used: Rolling walker (2 wheels) Transfers: Sit to/from Stand Sit to Stand: Contact guard assist, Supervision           General transfer comment: cues for LE management and use of UEs to self assist    Ambulation/Gait Ambulation/Gait assistance: Contact guard assist, Supervision Gait Distance (Feet): 200 Feet Assistive device: Rolling walker (2 wheels) Gait Pattern/deviations: Step-to pattern, Step-through pattern, Decreased step length - left, Decreased step length - right, Shuffle       General Gait Details: min cues for posture and position from RW   Stairs Stairs: Yes Stairs assistance: Contact guard assist Stair Management: No rails, Step to pattern, Forwards, With walker Number of Stairs: 1 General stair comments: min cues for sequence   Wheelchair Mobility     Tilt Bed    Modified Rankin (Stroke Patients Only)       Balance Overall balance assessment: Mild deficits observed, not formally tested                                           Cognition Arousal: Alert Behavior During Therapy: WFL for tasks assessed/performed Overall Cognitive Status: Within Functional Limits for tasks assessed                                          Exercises Total Joint Exercises Ankle Circles/Pumps: AROM, Both, 15 reps, Supine Quad Sets: AROM, Both, 10 reps, Supine Heel Slides: AAROM, Right, 20 reps, Supine Hip ABduction/ADduction: AAROM, Right, 15 reps, Supine General Exercises - Lower Extremity Ankle Circles/Pumps: AROM, Both, 15 reps, Supine    General Comments        Pertinent Vitals/Pain Pain Assessment Pain Assessment: 0-10 Pain Score: 4  Pain Location: R hip Pain Descriptors / Indicators: Aching, Sore Pain Intervention(s): Limited activity within patient's tolerance, Monitored during session, Premedicated before session, Ice applied    Home Living                          Prior Function            PT Goals (current goals can now be found in the care plan section) Acute Rehab PT Goals Patient Stated Goal: Regain IND PT Goal Formulation: With patient Time  For Goal Achievement: 09/05/23 Potential to Achieve Goals: Good Progress towards PT goals: Progressing toward goals    Frequency    7X/week      PT Plan      Co-evaluation              AM-PAC PT "6 Clicks" Mobility   Outcome Measure  Help needed turning from your back to your side while in a flat bed without using bedrails?: A Little Help needed moving from lying on your back to sitting on the side of a flat bed without using bedrails?: A Little Help needed moving to and from a bed to a chair (including a wheelchair)?: A Little Help needed standing up from a chair using your arms (e.g., wheelchair or bedside chair)?: A Little Help needed to walk in hospital room?: A Little Help needed climbing 3-5 steps with a railing? : A Little 6 Click Score: 18    End of Session Equipment Utilized During Treatment: Gait  belt Activity Tolerance: Patient tolerated treatment well Patient left: in chair;with call bell/phone within reach;with chair alarm set Nurse Communication: Mobility status PT Visit Diagnosis: Difficulty in walking, not elsewhere classified (R26.2)     Time: 0865-7846 PT Time Calculation (min) (ACUTE ONLY): 27 min  Charges:    $Gait Training: 8-22 mins $Therapeutic Activity: 8-22 mins PT General Charges $$ ACUTE PT VISIT: 1 Visit                     Mauro Kaufmann PT Acute Rehabilitation Services Pager [phone] Office [phone]    [name] 08/30/2023, 9:57 AM

## 2023-08-30 NOTE — Discharge Summary (Signed)
Patient ID: [name] MRN: [id number] DOB/AGE: 12-19-1965 57 y.o.  Admit date: 08/29/2023 Discharge date: 08/30/2023  Admission Diagnoses:  Principal Problem:   Unilateral primary osteoarthritis, right hip Active Problems:   Status post total replacement of right hip   Discharge Diagnoses:  Same  Past Medical History:  Diagnosis Date   Anxiety    Arthritis    Depression    Diabetes mellitus without complication (HCC)    type 2   Heart murmur    childhood heart murmur   High cholesterol    History of kidney stones    Hypertension     Surgeries: Procedure(s): RIGHT TOTAL HIP ARTHROPLASTY ANTERIOR APPROACH on 08/29/2023   Consultants:   Discharged Condition: Improved  Hospital Course: [name] is an 58 y.o. male who was admitted 08/29/2023 for operative treatment ofUnilateral primary osteoarthritis, right hip. Patient has severe unremitting pain that affects sleep, daily activities, and work/hobbies. After pre-op clearance the patient was taken to the operating room on 08/29/2023 and underwent  Procedure(s): RIGHT TOTAL HIP ARTHROPLASTY ANTERIOR APPROACH.    Patient was given perioperative antibiotics:  Anti-infectives (From admission, onward)    Start     Dose/Rate Route Frequency Ordered Stop   08/29/23 1330  ceFAZolin (ANCEF) IVPB 1 g/50 mL premix        1 g 100 mL/hr over 30 Minutes Intravenous Every 6 hours 08/29/23 1010 08/29/23 1906   08/29/23 0600  ceFAZolin (ANCEF) IVPB 2g/100 mL premix        2 g 200 mL/hr over 30 Minutes Intravenous On call to O.R. 08/29/23 0530 08/29/23 0725        Patient was given sequential compression devices, early ambulation, and chemoprophylaxis to prevent DVT.  Patient benefited maximally from hospital stay and there were no complications.    Recent vital signs: Patient Vitals for the past 24 hrs:  BP Temp Pulse Resp SpO2  08/30/23 1013 (!) 138/96 98.8 F (37.1 C) 82 18 97 %  08/30/23 0528 123/84 98.1 F (36.7  C) 80 17 95 %  08/30/23 0150 117/89 98.3 F (36.8 C) 92 17 97 %  08/29/23 2038 133/88 98.5 F (36.9 C) 88 17 97 %  08/29/23 1733 (!) 145/87 99.8 F (37.7 C) 95 16 95 %  08/29/23 1212 120/84 98.4 F (36.9 C) 90 16 95 %     Recent laboratory studies:  Recent Labs    08/30/23 0255  WBC 7.8  HGB 12.7*  HCT 38.1*  PLT 147*  NA 132*  K 3.5  CL 101  CO2 23  BUN 13  CREATININE 0.78  GLUCOSE 119*  CALCIUM 8.7*     Discharge Medications:   Allergies as of 08/30/2023   No Known Allergies      Medication List     TAKE these medications    amLODipine 5 MG tablet Commonly known as: NORVASC Take 1 tablet (5 mg total) by mouth daily.   aspirin 81 MG chewable tablet Chew 1 tablet (81 mg total) by mouth 2 (two) times daily.   BIOFREEZE EX Apply 1 Application topically daily as needed (knee pain).   glucose blood test strip 1 each by Other route as needed for other. Use as instructed   Jardiance 10 MG Tabs tablet Generic drug: empagliflozin Take 10 mg by mouth daily.   metFORMIN 500 MG 24 hr tablet Commonly known as: GLUCOPHAGE-XR Take 500 mg by mouth every evening.   methocarbamol 500 MG tablet Commonly known as:  ROBAXIN Take 1 tablet (500 mg total) by mouth every 6 (six) hours as needed for muscle spasms.   metoprolol succinate 50 MG 24 hr tablet Commonly known as: TOPROL-XL Take 1 tablet (50 mg total) by mouth daily. Take with or immediately following a meal.   nicotine 21 mg/24hr patch Commonly known as: Nicoderm CQ Place 1 patch (21 mg total) onto the skin daily.   olmesartan-hydrochlorothiazide 20-12.5 MG tablet Commonly known as: BENICAR HCT Take 1 tablet by mouth every morning.   oxyCODONE 5 MG immediate release tablet Commonly known as: Oxy IR/ROXICODONE Take 1-2 tablets (5-10 mg total) by mouth every 6 (six) hours as needed for moderate pain (pain score 4-6) (pain score 4-6).   Ozempic (0.25 or 0.5 MG/DOSE) 2 MG/3ML Sopn Generic drug:  Semaglutide(0.25 or 0.5MG /DOS) Inject 0.5 mg into the skin once a week.   rosuvastatin 20 MG tablet Commonly known as: CRESTOR TAKE 1 TABLET(20 MG) BY MOUTH DAILY What changed: See the new instructions.   sertraline 100 MG tablet Commonly known as: ZOLOFT Take 150 mg by mouth daily.   sildenafil 100 MG tablet Commonly known as: VIAGRA Take 100 mg by mouth daily as needed for erectile dysfunction. 1-2 daily prn   tamsulosin 0.4 MG Caps capsule Commonly known as: FLOMAX Take 0.4 mg by mouth daily after supper.               Durable Medical Equipment  (From admission, onward)           Start     Ordered   08/29/23 1011  DME 3 n 1  Once        08/29/23 1010   08/29/23 1011  DME Walker rolling  Once       Question Answer Comment  Walker: With 5 Inch Wheels   Patient needs a walker to treat with the following condition Status post total replacement of right hip      08/29/23 1010            Diagnostic Studies: DG Pelvis Portable  Result Date: 08/29/2023 CLINICAL DATA:  Post right hip replacement. EXAM: PORTABLE PELVIS 1-2 VIEWS COMPARISON:  None Available. FINDINGS: Right hip arthroplasty in expected alignment. No periprosthetic lucency or fracture. Recent postsurgical change includes air and edema in the soft tissues. Previous left hip arthroplasty. IMPRESSION: Right hip arthroplasty without immediate postoperative complication. Electronically Signed   By: Narda Rutherford M.D.   On: 08/29/2023 11:14   DG HIP UNILAT WITH PELVIS 1V RIGHT  Result Date: 08/29/2023 CLINICAL DATA:  Elective surgery. EXAM: DG HIP (WITH OR WITHOUT PELVIS) 1V RIGHT COMPARISON:  None Available. FINDINGS: Two fluoroscopic spot views of the pelvis and right hip obtained in the operating room. Images during hip arthroplasty. Fluoroscopy time 17 seconds. Dose 3.7468 mGy. IMPRESSION: Intraoperative fluoroscopy during right hip arthroplasty. Electronically Signed   By: Narda Rutherford M.D.    On: 08/29/2023 09:22   DG C-Arm 1-60 Min-No Report  Result Date: 08/29/2023 Fluoroscopy was utilized by the requesting physician.  No radiographic interpretation.   DG C-Arm 1-60 Min-No Report  Result Date: 08/29/2023 Fluoroscopy was utilized by the requesting physician.  No radiographic interpretation.    Disposition: Discharge disposition: 01-Home or Self Care          Follow-up Information     [facility] Follow up.   Specialty: Home Health Services Why: to provide home physical therapy visits Contact information: [address] [place]  [address] [phone]         Kathryne Hitch, MD Follow up in 2 week(s).   Specialty: Orthopedic Surgery Contact information: [address] [place] [address] [phone]                  Signed: Kathryne Hitch 08/30/2023, 10:43 AM

## 2023-08-30 NOTE — Discharge Instructions (Signed)

## 2023-08-31 DIAGNOSIS — Z7982 Long term (current) use of aspirin: Secondary | ICD-10-CM | POA: Diagnosis not present

## 2023-08-31 DIAGNOSIS — F17210 Nicotine dependence, cigarettes, uncomplicated: Secondary | ICD-10-CM | POA: Diagnosis not present

## 2023-08-31 DIAGNOSIS — Z556 Problems related to health literacy: Secondary | ICD-10-CM | POA: Diagnosis not present

## 2023-08-31 DIAGNOSIS — E119 Type 2 diabetes mellitus without complications: Secondary | ICD-10-CM | POA: Diagnosis not present

## 2023-08-31 DIAGNOSIS — F419 Anxiety disorder, unspecified: Secondary | ICD-10-CM | POA: Diagnosis not present

## 2023-08-31 DIAGNOSIS — F32A Depression, unspecified: Secondary | ICD-10-CM | POA: Diagnosis not present

## 2023-08-31 DIAGNOSIS — Z7984 Long term (current) use of oral hypoglycemic drugs: Secondary | ICD-10-CM | POA: Diagnosis not present

## 2023-08-31 DIAGNOSIS — Z96641 Presence of right artificial hip joint: Secondary | ICD-10-CM | POA: Diagnosis not present

## 2023-08-31 DIAGNOSIS — E7800 Pure hypercholesterolemia, unspecified: Secondary | ICD-10-CM | POA: Diagnosis not present

## 2023-08-31 DIAGNOSIS — I10 Essential (primary) hypertension: Secondary | ICD-10-CM | POA: Diagnosis not present

## 2023-08-31 DIAGNOSIS — Z7985 Long-term (current) use of injectable non-insulin antidiabetic drugs: Secondary | ICD-10-CM | POA: Diagnosis not present

## 2023-08-31 DIAGNOSIS — Z87442 Personal history of urinary calculi: Secondary | ICD-10-CM | POA: Diagnosis not present

## 2023-08-31 DIAGNOSIS — Z96642 Presence of left artificial hip joint: Secondary | ICD-10-CM | POA: Diagnosis not present

## 2023-08-31 DIAGNOSIS — Z471 Aftercare following joint replacement surgery: Secondary | ICD-10-CM | POA: Diagnosis not present

## 2023-08-31 NOTE — Anesthesia Postprocedure Evaluation (Signed)
Anesthesia Post Note  Patient: [name]  Procedure(s) Performed: RIGHT TOTAL HIP ARTHROPLASTY ANTERIOR APPROACH (Right: Hip)     Patient location during evaluation: PACU Anesthesia Type: MAC and Spinal Level of consciousness: oriented and awake and alert Pain management: pain level controlled Vital Signs Assessment: post-procedure vital signs reviewed and stable Respiratory status: spontaneous breathing, respiratory function stable and patient connected to nasal cannula oxygen Cardiovascular status: blood pressure returned to baseline and stable Postop Assessment: no headache, no backache and no apparent nausea or vomiting Anesthetic complications: no   No notable events documented.  Last Vitals:  Vitals:   08/30/23 0528 08/30/23 1013  BP: 123/84 (!) 138/96  Pulse: 80 82  Resp: 17 18  Temp: 36.7 C 37.1 C  SpO2: 95% 97%    Last Pain:  Vitals:   08/30/23 0841  TempSrc:   PainSc: 4                  Iisha Soyars S

## 2023-09-01 ENCOUNTER — Encounter (HOSPITAL_COMMUNITY): Payer: Self-pay | Admitting: Orthopaedic Surgery

## 2023-09-03 ENCOUNTER — Encounter: Payer: Self-pay | Admitting: Cardiology

## 2023-09-03 DIAGNOSIS — F419 Anxiety disorder, unspecified: Secondary | ICD-10-CM | POA: Diagnosis not present

## 2023-09-03 DIAGNOSIS — Z7985 Long-term (current) use of injectable non-insulin antidiabetic drugs: Secondary | ICD-10-CM | POA: Diagnosis not present

## 2023-09-03 DIAGNOSIS — F32A Depression, unspecified: Secondary | ICD-10-CM | POA: Diagnosis not present

## 2023-09-03 DIAGNOSIS — F17210 Nicotine dependence, cigarettes, uncomplicated: Secondary | ICD-10-CM | POA: Diagnosis not present

## 2023-09-03 DIAGNOSIS — Z87442 Personal history of urinary calculi: Secondary | ICD-10-CM | POA: Diagnosis not present

## 2023-09-03 DIAGNOSIS — Z7984 Long term (current) use of oral hypoglycemic drugs: Secondary | ICD-10-CM | POA: Diagnosis not present

## 2023-09-03 DIAGNOSIS — I10 Essential (primary) hypertension: Secondary | ICD-10-CM | POA: Diagnosis not present

## 2023-09-03 DIAGNOSIS — Z96641 Presence of right artificial hip joint: Secondary | ICD-10-CM | POA: Diagnosis not present

## 2023-09-03 DIAGNOSIS — Z7982 Long term (current) use of aspirin: Secondary | ICD-10-CM | POA: Diagnosis not present

## 2023-09-03 DIAGNOSIS — Z556 Problems related to health literacy: Secondary | ICD-10-CM | POA: Diagnosis not present

## 2023-09-03 DIAGNOSIS — Z96642 Presence of left artificial hip joint: Secondary | ICD-10-CM | POA: Diagnosis not present

## 2023-09-03 DIAGNOSIS — Z471 Aftercare following joint replacement surgery: Secondary | ICD-10-CM | POA: Diagnosis not present

## 2023-09-03 DIAGNOSIS — E119 Type 2 diabetes mellitus without complications: Secondary | ICD-10-CM | POA: Diagnosis not present

## 2023-09-03 DIAGNOSIS — E7800 Pure hypercholesterolemia, unspecified: Secondary | ICD-10-CM | POA: Diagnosis not present

## 2023-09-05 DIAGNOSIS — E7800 Pure hypercholesterolemia, unspecified: Secondary | ICD-10-CM | POA: Diagnosis not present

## 2023-09-05 DIAGNOSIS — E119 Type 2 diabetes mellitus without complications: Secondary | ICD-10-CM | POA: Diagnosis not present

## 2023-09-05 DIAGNOSIS — Z7985 Long-term (current) use of injectable non-insulin antidiabetic drugs: Secondary | ICD-10-CM | POA: Diagnosis not present

## 2023-09-05 DIAGNOSIS — Z7984 Long term (current) use of oral hypoglycemic drugs: Secondary | ICD-10-CM | POA: Diagnosis not present

## 2023-09-05 DIAGNOSIS — F419 Anxiety disorder, unspecified: Secondary | ICD-10-CM | POA: Diagnosis not present

## 2023-09-05 DIAGNOSIS — I10 Essential (primary) hypertension: Secondary | ICD-10-CM | POA: Diagnosis not present

## 2023-09-05 DIAGNOSIS — Z96642 Presence of left artificial hip joint: Secondary | ICD-10-CM | POA: Diagnosis not present

## 2023-09-05 DIAGNOSIS — Z96641 Presence of right artificial hip joint: Secondary | ICD-10-CM | POA: Diagnosis not present

## 2023-09-05 DIAGNOSIS — Z471 Aftercare following joint replacement surgery: Secondary | ICD-10-CM | POA: Diagnosis not present

## 2023-09-05 DIAGNOSIS — Z556 Problems related to health literacy: Secondary | ICD-10-CM | POA: Diagnosis not present

## 2023-09-05 DIAGNOSIS — Z87442 Personal history of urinary calculi: Secondary | ICD-10-CM | POA: Diagnosis not present

## 2023-09-05 DIAGNOSIS — F17210 Nicotine dependence, cigarettes, uncomplicated: Secondary | ICD-10-CM | POA: Diagnosis not present

## 2023-09-05 DIAGNOSIS — F32A Depression, unspecified: Secondary | ICD-10-CM | POA: Diagnosis not present

## 2023-09-05 DIAGNOSIS — Z7982 Long term (current) use of aspirin: Secondary | ICD-10-CM | POA: Diagnosis not present

## 2023-09-08 DIAGNOSIS — E7800 Pure hypercholesterolemia, unspecified: Secondary | ICD-10-CM | POA: Diagnosis not present

## 2023-09-08 DIAGNOSIS — Z96641 Presence of right artificial hip joint: Secondary | ICD-10-CM | POA: Diagnosis not present

## 2023-09-08 DIAGNOSIS — F419 Anxiety disorder, unspecified: Secondary | ICD-10-CM | POA: Diagnosis not present

## 2023-09-08 DIAGNOSIS — F17210 Nicotine dependence, cigarettes, uncomplicated: Secondary | ICD-10-CM | POA: Diagnosis not present

## 2023-09-08 DIAGNOSIS — I10 Essential (primary) hypertension: Secondary | ICD-10-CM | POA: Diagnosis not present

## 2023-09-08 DIAGNOSIS — Z7985 Long-term (current) use of injectable non-insulin antidiabetic drugs: Secondary | ICD-10-CM | POA: Diagnosis not present

## 2023-09-08 DIAGNOSIS — Z7984 Long term (current) use of oral hypoglycemic drugs: Secondary | ICD-10-CM | POA: Diagnosis not present

## 2023-09-08 DIAGNOSIS — E119 Type 2 diabetes mellitus without complications: Secondary | ICD-10-CM | POA: Diagnosis not present

## 2023-09-08 DIAGNOSIS — Z87442 Personal history of urinary calculi: Secondary | ICD-10-CM | POA: Diagnosis not present

## 2023-09-08 DIAGNOSIS — Z96642 Presence of left artificial hip joint: Secondary | ICD-10-CM | POA: Diagnosis not present

## 2023-09-08 DIAGNOSIS — F32A Depression, unspecified: Secondary | ICD-10-CM | POA: Diagnosis not present

## 2023-09-08 DIAGNOSIS — Z556 Problems related to health literacy: Secondary | ICD-10-CM | POA: Diagnosis not present

## 2023-09-08 DIAGNOSIS — Z471 Aftercare following joint replacement surgery: Secondary | ICD-10-CM | POA: Diagnosis not present

## 2023-09-08 DIAGNOSIS — Z7982 Long term (current) use of aspirin: Secondary | ICD-10-CM | POA: Diagnosis not present

## 2023-09-10 DIAGNOSIS — Z556 Problems related to health literacy: Secondary | ICD-10-CM | POA: Diagnosis not present

## 2023-09-10 DIAGNOSIS — Z7984 Long term (current) use of oral hypoglycemic drugs: Secondary | ICD-10-CM | POA: Diagnosis not present

## 2023-09-10 DIAGNOSIS — Z96641 Presence of right artificial hip joint: Secondary | ICD-10-CM | POA: Diagnosis not present

## 2023-09-10 DIAGNOSIS — Z96642 Presence of left artificial hip joint: Secondary | ICD-10-CM | POA: Diagnosis not present

## 2023-09-10 DIAGNOSIS — F419 Anxiety disorder, unspecified: Secondary | ICD-10-CM | POA: Diagnosis not present

## 2023-09-10 DIAGNOSIS — I10 Essential (primary) hypertension: Secondary | ICD-10-CM | POA: Diagnosis not present

## 2023-09-10 DIAGNOSIS — F32A Depression, unspecified: Secondary | ICD-10-CM | POA: Diagnosis not present

## 2023-09-10 DIAGNOSIS — Z471 Aftercare following joint replacement surgery: Secondary | ICD-10-CM | POA: Diagnosis not present

## 2023-09-10 DIAGNOSIS — Z7985 Long-term (current) use of injectable non-insulin antidiabetic drugs: Secondary | ICD-10-CM | POA: Diagnosis not present

## 2023-09-10 DIAGNOSIS — Z87442 Personal history of urinary calculi: Secondary | ICD-10-CM | POA: Diagnosis not present

## 2023-09-10 DIAGNOSIS — E7800 Pure hypercholesterolemia, unspecified: Secondary | ICD-10-CM | POA: Diagnosis not present

## 2023-09-10 DIAGNOSIS — E119 Type 2 diabetes mellitus without complications: Secondary | ICD-10-CM | POA: Diagnosis not present

## 2023-09-10 DIAGNOSIS — F17210 Nicotine dependence, cigarettes, uncomplicated: Secondary | ICD-10-CM | POA: Diagnosis not present

## 2023-09-10 DIAGNOSIS — Z7982 Long term (current) use of aspirin: Secondary | ICD-10-CM | POA: Diagnosis not present

## 2023-09-15 ENCOUNTER — Ambulatory Visit (INDEPENDENT_AMBULATORY_CARE_PROVIDER_SITE_OTHER): Payer: BC Managed Care – PPO | Admitting: Orthopaedic Surgery

## 2023-09-15 ENCOUNTER — Encounter: Payer: Self-pay | Admitting: Orthopaedic Surgery

## 2023-09-15 DIAGNOSIS — Z96641 Presence of right artificial hip joint: Secondary | ICD-10-CM

## 2023-09-15 NOTE — Progress Notes (Signed)
The patient is here for his first postoperative visit status post a right total hip replacement.  We replaced his left hip and just June of this year.  He is already walking without any assistive device.  He is an active 57 year old gentleman.  He has finished home health therapy as well.  He has been compliant with a baby aspirin twice a day and denies calf pain.  When we have him lay in a supine position his leg lengths are equal.  His right hip incision looks good.  The staples were removed and Steri-Strips applied.  He will continue to increase his activities as comfort allows.  We will see him back in a month to see how he is doing overall but no x-rays are needed.  If he looks good at that visit we may not need to see him for 6 months.

## 2023-10-20 ENCOUNTER — Ambulatory Visit (INDEPENDENT_AMBULATORY_CARE_PROVIDER_SITE_OTHER): Payer: BC Managed Care – PPO | Admitting: Orthopaedic Surgery

## 2023-10-20 ENCOUNTER — Encounter: Payer: Self-pay | Admitting: Orthopaedic Surgery

## 2023-10-20 DIAGNOSIS — Z96641 Presence of right artificial hip joint: Secondary | ICD-10-CM

## 2023-10-20 NOTE — Progress Notes (Signed)
 The patient is now 6 weeks status post a right total hip arthroplasty.  We replaced his left hip back in June.  He is 57 and active.  He is scheduled to go on his fishing trip to [place] later this month.  He is doing well overall.  He has some discomfort with exercise but overall he is doing well.  When he first gets up from a chair he has some soreness and he knows that will dissipate with time.  He is walking without assistive device.  There is only slight limp to his gait.  His leg lengths appear near equal.  There is still some stiffness with both hips but he looks much better overall.  He will continue to increase his activities as comfort allows.  The next time we need to see him back is in 6 months.  Will just have a standing AP pelvis at that visit.

## 2023-12-04 ENCOUNTER — Other Ambulatory Visit (HOSPITAL_COMMUNITY): Payer: Self-pay

## 2023-12-04 ENCOUNTER — Other Ambulatory Visit: Payer: Self-pay | Admitting: Cardiology

## 2023-12-04 DIAGNOSIS — F17209 Nicotine dependence, unspecified, with unspecified nicotine-induced disorders: Secondary | ICD-10-CM

## 2023-12-04 MED ORDER — NICOTINE 14 MG/24HR TD PT24: 14.0000 mg | MEDICATED_PATCH | Freq: Every day | TRANSDERMAL | 0 refills | Status: DC

## 2023-12-04 MED ORDER — NICOTINE 21 MG/24HR TD PT24
21.0000 mg | MEDICATED_PATCH | Freq: Every day | TRANSDERMAL | 0 refills | Status: AC
  Filled 2023-12-04 (×2): qty 28, 28d supply, fill #0

## 2023-12-16 ENCOUNTER — Other Ambulatory Visit (HOSPITAL_COMMUNITY): Payer: Self-pay

## 2023-12-16 DIAGNOSIS — M722 Plantar fascial fibromatosis: Secondary | ICD-10-CM | POA: Diagnosis not present

## 2023-12-16 DIAGNOSIS — M79671 Pain in right foot: Secondary | ICD-10-CM | POA: Diagnosis not present

## 2023-12-16 DIAGNOSIS — M7732 Calcaneal spur, left foot: Secondary | ICD-10-CM | POA: Diagnosis not present

## 2023-12-16 DIAGNOSIS — M7731 Calcaneal spur, right foot: Secondary | ICD-10-CM | POA: Diagnosis not present

## 2024-01-01 DIAGNOSIS — M7731 Calcaneal spur, right foot: Secondary | ICD-10-CM | POA: Diagnosis not present

## 2024-01-01 DIAGNOSIS — M79672 Pain in left foot: Secondary | ICD-10-CM | POA: Diagnosis not present

## 2024-01-01 DIAGNOSIS — M722 Plantar fascial fibromatosis: Secondary | ICD-10-CM | POA: Diagnosis not present

## 2024-01-01 DIAGNOSIS — M7732 Calcaneal spur, left foot: Secondary | ICD-10-CM | POA: Diagnosis not present

## 2024-01-01 DIAGNOSIS — M79671 Pain in right foot: Secondary | ICD-10-CM | POA: Diagnosis not present

## 2024-01-07 DIAGNOSIS — E785 Hyperlipidemia, unspecified: Secondary | ICD-10-CM | POA: Diagnosis not present

## 2024-01-07 DIAGNOSIS — I10 Essential (primary) hypertension: Secondary | ICD-10-CM | POA: Diagnosis not present

## 2024-01-07 DIAGNOSIS — E1169 Type 2 diabetes mellitus with other specified complication: Secondary | ICD-10-CM | POA: Diagnosis not present

## 2024-01-07 DIAGNOSIS — F411 Generalized anxiety disorder: Secondary | ICD-10-CM | POA: Diagnosis not present

## 2024-01-22 DIAGNOSIS — M7732 Calcaneal spur, left foot: Secondary | ICD-10-CM | POA: Diagnosis not present

## 2024-01-22 DIAGNOSIS — M79672 Pain in left foot: Secondary | ICD-10-CM | POA: Diagnosis not present

## 2024-01-22 DIAGNOSIS — E119 Type 2 diabetes mellitus without complications: Secondary | ICD-10-CM | POA: Diagnosis not present

## 2024-01-22 DIAGNOSIS — M722 Plantar fascial fibromatosis: Secondary | ICD-10-CM | POA: Diagnosis not present

## 2024-02-04 DIAGNOSIS — H53143 Visual discomfort, bilateral: Secondary | ICD-10-CM | POA: Diagnosis not present

## 2024-02-04 DIAGNOSIS — E119 Type 2 diabetes mellitus without complications: Secondary | ICD-10-CM | POA: Diagnosis not present

## 2024-03-04 DIAGNOSIS — C44229 Squamous cell carcinoma of skin of left ear and external auricular canal: Secondary | ICD-10-CM | POA: Diagnosis not present

## 2024-03-04 DIAGNOSIS — D485 Neoplasm of uncertain behavior of skin: Secondary | ICD-10-CM | POA: Diagnosis not present

## 2024-03-04 DIAGNOSIS — D229 Melanocytic nevi, unspecified: Secondary | ICD-10-CM | POA: Diagnosis not present

## 2024-03-04 DIAGNOSIS — L821 Other seborrheic keratosis: Secondary | ICD-10-CM | POA: Diagnosis not present

## 2024-03-04 DIAGNOSIS — L578 Other skin changes due to chronic exposure to nonionizing radiation: Secondary | ICD-10-CM | POA: Diagnosis not present

## 2024-03-04 DIAGNOSIS — L570 Actinic keratosis: Secondary | ICD-10-CM | POA: Diagnosis not present

## 2024-03-04 DIAGNOSIS — L814 Other melanin hyperpigmentation: Secondary | ICD-10-CM | POA: Diagnosis not present

## 2024-03-30 ENCOUNTER — Other Ambulatory Visit: Payer: Self-pay | Admitting: Cardiology

## 2024-03-30 DIAGNOSIS — D0422 Carcinoma in situ of skin of left ear and external auricular canal: Secondary | ICD-10-CM | POA: Diagnosis not present

## 2024-03-30 DIAGNOSIS — E119 Type 2 diabetes mellitus without complications: Secondary | ICD-10-CM

## 2024-04-05 ENCOUNTER — Other Ambulatory Visit: Payer: Self-pay | Admitting: Cardiology

## 2024-04-05 DIAGNOSIS — E119 Type 2 diabetes mellitus without complications: Secondary | ICD-10-CM

## 2024-04-06 ENCOUNTER — Telehealth: Payer: Self-pay | Admitting: Cardiology

## 2024-04-06 MED ORDER — EMPAGLIFLOZIN 10 MG PO TABS: 10.0000 mg | ORAL_TABLET | Freq: Every day | ORAL | 0 refills | Status: DC

## 2024-04-06 MED ORDER — EMPAGLIFLOZIN 10 MG PO TABS: 10.0000 mg | ORAL_TABLET | Freq: Every day | ORAL | 0 refills | Status: AC

## 2024-04-06 NOTE — Telephone Encounter (Signed)
 Pt's medication was sent to pt's pharmacy as requested. Confirmation received.

## 2024-04-06 NOTE — Telephone Encounter (Signed)
*  STAT* If patient is at the pharmacy, call can be transferred to refill team.   1. Which medications need to be refilled? (please list name of each medication and dose if known) empagliflozin  (JARDIANCE ) 10 MG TABS tablet   2. Which pharmacy/location (including street and city if local pharmacy) is medication to be sent to? [id number] - [place], [place] - [address] AT [address]   3. Do they need a 30 day or 90 day supply?  90 day supply

## 2024-04-06 NOTE — Addendum Note (Signed)
 Addended by: BLUFORD RAMP D on: 04/06/2024 02:16 PM   Modules accepted: Orders

## 2024-04-06 NOTE — Telephone Encounter (Signed)
*  STAT* If patient is at the pharmacy, call can be transferred to refill team.   1. Which medications need to be refilled? (please list name of each medication and dose if known) new prescription for Jardiance  25 mg wrong milligram was sent over   2. Would you like to learn more about the convenience, safety, & potential cost savings by using the [facility]?      3. Are you open to using the [facility] (Type [facility].    4. Which pharmacy/location (including street and city if local pharmacy) is medication to be sent to?  [facility] [address] [place] ,[place]   5. Do they need a 30 day or 90 day supply? 90 days and refills- patient is there waiting

## 2024-04-26 ENCOUNTER — Other Ambulatory Visit (INDEPENDENT_AMBULATORY_CARE_PROVIDER_SITE_OTHER): Payer: Self-pay

## 2024-04-26 ENCOUNTER — Ambulatory Visit (INDEPENDENT_AMBULATORY_CARE_PROVIDER_SITE_OTHER): Payer: BC Managed Care – PPO | Admitting: Orthopaedic Surgery

## 2024-04-26 ENCOUNTER — Encounter: Payer: Self-pay | Admitting: Orthopaedic Surgery

## 2024-04-26 DIAGNOSIS — Z96641 Presence of right artificial hip joint: Secondary | ICD-10-CM

## 2024-04-26 NOTE — Progress Notes (Signed)
 [name] is a 58 year old gentleman who is here today in follow-up for both of his hips.  We replaced both of these last year with the left one being replaced earlier in the year on the right when in November.  He says the hips are doing great.  He is send is a lot of patients as well.  He reports good range of motion and strength.  He says his ADLs are easier such as crossing his legs and putting his shoes and socks on.  He walks with a normal gait and his leg lengths are equal.  Both hips move smoothly and fluidly with no blocks or rotation and no discomfort or pain at all.  An AP pelvis standing shows bilateral total hip arthroplasties that are well-seated with no complicating features.  At This point follow-up can be as needed for his hips.  If he does develop any issues with his hips or other joints he knows to reach out and let us  know.

## 2024-06-03 ENCOUNTER — Ambulatory Visit: Attending: Cardiology | Admitting: Cardiology

## 2024-06-03 ENCOUNTER — Other Ambulatory Visit: Payer: Self-pay | Admitting: Cardiology

## 2024-06-03 ENCOUNTER — Other Ambulatory Visit (HOSPITAL_COMMUNITY): Payer: Self-pay

## 2024-06-03 ENCOUNTER — Encounter: Payer: Self-pay | Admitting: Cardiology

## 2024-06-03 VITALS — BP 100/65 | HR 91 | Resp 16 | Ht 70.0 in | Wt 261.0 lb

## 2024-06-03 DIAGNOSIS — Q2381 Bicuspid aortic valve: Secondary | ICD-10-CM | POA: Diagnosis not present

## 2024-06-03 DIAGNOSIS — I453 Trifascicular block: Secondary | ICD-10-CM

## 2024-06-03 DIAGNOSIS — E7800 Pure hypercholesterolemia, unspecified: Secondary | ICD-10-CM

## 2024-06-03 DIAGNOSIS — I10 Essential (primary) hypertension: Secondary | ICD-10-CM

## 2024-06-03 DIAGNOSIS — E119 Type 2 diabetes mellitus without complications: Secondary | ICD-10-CM

## 2024-06-03 DIAGNOSIS — R9439 Abnormal result of other cardiovascular function study: Secondary | ICD-10-CM

## 2024-06-03 MED ORDER — METOPROLOL SUCCINATE ER 25 MG PO TB24: 25.0000 mg | ORAL_TABLET | Freq: Every day | ORAL | 3 refills | Status: AC

## 2024-06-03 MED ORDER — SEMAGLUTIDE (1 MG/DOSE) 4 MG/3ML ~~LOC~~ SOPN
1.0000 mg | PEN_INJECTOR | SUBCUTANEOUS | 1 refills | Status: AC
  Filled 2024-06-03: qty 3, 28d supply, fill #0

## 2024-06-03 NOTE — Patient Instructions (Addendum)
 Medication Instructions:  Your physician has recommended you make the following change in your medication: Increase Ozempic  to 1 mg inject into skin once per week Decrease Metoprolol  Succinate to 25 mg by mouth daily   *If you need a refill on your cardiac medications before your next appointment, please call your pharmacy*  Lab Work: none If you have labs (blood work) drawn today and your tests are completely normal, you will receive your results only by: MyChart Message (if you have MyChart) OR A paper copy in the mail If you have any lab test that is abnormal or we need to change your treatment, we will call you to review the results.  Testing/Procedures: none  Follow-Up: At [facility], you and your health needs are our priority.  As part of our continuing mission to provide you with exceptional heart care, our providers are all part of one team.  This team includes your primary Cardiologist (physician) and Advanced Practice Providers or APPs (Physician Assistants and Nurse Practitioners) who all work together to provide you with the care you need, when you need it.  Your next appointment:   12 month(s)  Provider:   Gordy Bergamo, MD    We recommend signing up for the patient portal called MyChart.  Sign up information is provided on this After Visit Summary.  MyChart is used to connect with patients for Virtual Visits (Telemedicine).  Patients are able to view lab/test results, encounter notes, upcoming appointments, etc.  Non-urgent messages can be sent to your provider as well.   To learn more about what you can do with MyChart, go to [email or web address].   Other Instructions

## 2024-06-03 NOTE — Progress Notes (Signed)
 Cardiology Office Note:  .   Date:  06/03/2024  ID:  [name], DOB 09-02-1966, MRN [id number] PCP: Marvene Prentice SAUNDERS, FNP  [facility] Providers Cardiologist:  Gordy Bergamo, MD   History of Present Illness: .   [name] is a 58 y.o. . Caucasian male patient with tobacco use disorder, hypertension, hypercholesterolemia, diabetes mellitus, tobacco use disorder, obesity, bicuspid aortic valve on evaluation for preoperative cardiac restratification abnormal EKG had abnormal Lexiscan  nuclear stress test on 04/03/2023 revealing inferior ischemia with mild to moderate decrease in LVEF at 41% and echocardiogram at that time revealing normal LVEF and bicuspid aortic valve without stenosis.  Ascending aorta was normal.  He underwent left total hip arthroplasty on 04/04/2023 without any periprocedural complications and is presently on medical therapy.  This is an annual visit.  Cardiac Studies relevent.    Echocardiogram 05/14/2023: Normal LVEF, 64%. Bicuspid aortic valve with mild aortic valve calcification.  Aortic root and ascending aorta are normal.  Lexiscan  perfusion nuclear scan 04/03/2023: Severe degree medium extent inferior and apical anterior defect suggestive of scar with mild peri-infarct ischemia.  Nondiagnostic EKG.    Discussed the use of AI scribe software for clinical note transcription with the patient, who gave verbal consent to proceed.  History of Present Illness [name] is a 58 year old male who presents for follow-up regarding weight management and medication adjustment.  He has gained weight since hip surgery and has not returned to his previous activity level due to fear of injury. He is taking Ozempic  0.5 mg, which has not been effective for weight loss. He has improved his diet by eating more fruits since returning from a trip to [place].  He is on omisartan 20 mg, amlodipine  5 mg, and metoprolol  tartrate 50 mg daily for blood pressure management. He  experiences occasional drops in blood pressure, which he attributes to his medication regimen. He monitors his blood sugar levels, typically below 125 mg/dL, often around 892-894 mg/dL. No unexplained episodes of syncope are reported.   Labs   No results found for: CHOL, HDL, LDLCALC, LDLDIRECT, TRIG, CHOLHDL No results found for: LIPOA  Recent Labs    08/18/23 1025 08/30/23 0255  NA 139 132*  K 3.9 3.5  CL 103 101  CO2 23 23  GLUCOSE 100* 119*  BUN 16 13  CREATININE 0.71 0.78  CALCIUM  9.7 8.7*  GFRNONAA >60 >60    Lab Results  Component Value Date   ALT 30 08/18/2023   AST 26 08/18/2023   ALKPHOS 55 08/18/2023   BILITOT 0.8 08/18/2023      Latest Ref Rng & Units 08/30/2023    2:55 AM 08/18/2023   10:25 AM 04/05/2023    3:36 AM  CBC  WBC 4.0 - 10.5 K/uL 7.8  6.5  11.3   Hemoglobin 13.0 - 17.0 g/dL 87.2  85.5  86.2   Hematocrit 39.0 - 52.0 % 38.1  42.4  40.2   Platelets 150 - 400 K/uL 147  135  133    Lab Results  Component Value Date   HGBA1C 6.0 (H) 08/18/2023     ROS  Review of Systems  Cardiovascular:  Negative for chest pain, dyspnea on exertion and leg swelling.   Physical Exam:   VS:  BP 100/65 (BP Location: Left Arm, Patient Position: Sitting, Cuff Size: Normal)   Pulse 91   Resp 16   Ht 5' 10 (1.778 m)   Wt 261 lb (118.4 kg)  SpO2 94%   BMI 37.45 kg/m    Wt Readings from Last 3 Encounters:  06/03/24 261 lb (118.4 kg)  08/29/23 250 lb (113.4 kg)  08/18/23 250 lb (113.4 kg)    BP Readings from Last 3 Encounters:  06/03/24 100/65  08/30/23 (!) 138/96  08/18/23 (!) 149/87   Physical Exam Constitutional:      Appearance: He is obese.  Neck:     Vascular: No carotid bruit or JVD.  Cardiovascular:     Rate and Rhythm: Normal rate and regular rhythm.     Pulses: Intact distal pulses.     Heart sounds: Normal heart sounds. No murmur heard.    No gallop.  Pulmonary:     Effort: Pulmonary effort is normal.     Breath sounds:  Normal breath sounds.  Abdominal:     General: Bowel sounds are normal.     Palpations: Abdomen is soft.  Musculoskeletal:     Right lower leg: No edema.     Left lower leg: No edema.    EKG:    EKG Interpretation Date/Time:  Thursday June 03 2024 13:51:57 EDT Ventricular Rate:  82 PR Interval:  228 QRS Duration:  178 QT Interval:  424 QTC Calculation: 495 R Axis:   -72  Text Interpretation: EKG 06/03/2024: Sinus rhythm with first-degree AV block at rate of 82 bpm, left anterior fascicular block.  Right bundle branch block.  Trifascicular block.  Compared to 03/27/2023, first-degree AV block is new.  Previously bifascicular block now trifascicular block. Confirmed by Vishwa Dais, Jagadeesh [phone]) on 06/03/2024 2:09:47 PM  EKG 03/31/2023: Normal sinus rhythm with rate of 88 bpm, leftward enlargement, left axis deviation, left anterior fascicular block. Right bundle branch block. Bifascicular block.   ASSESSMENT AND PLAN: .      ICD-10-CM   1. Bicuspid aortic valve  Q23.81 EKG 12-Lead    2. 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 Hypercholesteremia  E78.00     5. Type 2 diabetes mellitus without complication, without long-term current use of insulin  (HCC)  E11.9 Semaglutide , 1 MG/DOSE, 4 MG/3ML SOPN    6. Abnormal nuclear stress test  R94.39 metoprolol  succinate (TOPROL -XL) 25 MG 24 hr tablet     Assessment & Plan Abnormal nuclear stress test with inferior ischemia noted 04/03/2023 He is presently doing well without symptoms of angina, he is on appropriate medical therapy with good control of blood pressure and also lipids.  Hence continued medical therapy is recommended.  Overweight He has gained weight post-hip surgery due to reduced activity. Current Ozempic  dose of 0.5 mg is ineffective for weight loss. He has improved his diet by incorporating more fruits. - Increase Ozempic  dose to 1 mg. - Advise to  eat slowly and take a 5-minute break during meals to enhance satiety signaling. - Send a prescription for a 51-month supply of Ozempic  with one refill. - Follow up with PCP to consider increasing Ozempic  to 2.4 mg after a month.  Type 2 diabetes mellitus Type 2 diabetes is managed with Ozempic  and Jardiance . Morning blood sugar levels are consistently below 125 mg/dL, within the target range. - Increase Ozempic  dose as part of weight management and diabetes control. - Monitor blood sugar levels regularly, aiming to keep them below 125 mg/dL.  Hypertension Hypertension is well controlled with olmesartan  20 mg, amlodipine  5 mg, and metoprolol  succinate 50 mg. Occasional low  blood pressure and slightly prolonged PR interval on EKG, likely due to beta-blocker use. - Reduce metoprolol  succinate dose from 50 mg to 25 mg daily both in view of soft blood pressure, dizziness and also due to presence of new trifascicular block. - Send a new prescription for metoprolol  succinate 25 mg to the pharmacy.  Bicuspid aortic valve The bicuspid aortic valve is being monitored annually with no current need for echocardiogram. - Continue annual monitoring of the bicuspid aortic valve.   Follow up: 1 year for trifascicular block, hypertension and is stable on a as needed basis.  Signed,  Gordy Bergamo, MD, [facility] 06/03/2024, 2:29 PM [facility] [address] [place], [place] [address] Phone: [phone]. Fax:  [phone]

## 2024-06-16 ENCOUNTER — Other Ambulatory Visit: Payer: Self-pay | Admitting: Cardiology

## 2024-06-16 DIAGNOSIS — I10 Essential (primary) hypertension: Secondary | ICD-10-CM

## 2024-06-16 DIAGNOSIS — R9439 Abnormal result of other cardiovascular function study: Secondary | ICD-10-CM

## 2024-07-02 DIAGNOSIS — N529 Male erectile dysfunction, unspecified: Secondary | ICD-10-CM | POA: Diagnosis not present

## 2024-07-02 DIAGNOSIS — Z23 Encounter for immunization: Secondary | ICD-10-CM | POA: Diagnosis not present

## 2024-07-02 DIAGNOSIS — I10 Essential (primary) hypertension: Secondary | ICD-10-CM | POA: Diagnosis not present

## 2024-07-02 DIAGNOSIS — E1169 Type 2 diabetes mellitus with other specified complication: Secondary | ICD-10-CM | POA: Diagnosis not present

## 2024-07-02 DIAGNOSIS — Z0000 Encounter for general adult medical examination without abnormal findings: Secondary | ICD-10-CM | POA: Diagnosis not present

## 2024-07-02 DIAGNOSIS — E785 Hyperlipidemia, unspecified: Secondary | ICD-10-CM | POA: Diagnosis not present

## 2024-07-05 DIAGNOSIS — M7672 Peroneal tendinitis, left leg: Secondary | ICD-10-CM | POA: Diagnosis not present

## 2024-07-05 DIAGNOSIS — M25572 Pain in left ankle and joints of left foot: Secondary | ICD-10-CM | POA: Diagnosis not present

## 2024-07-05 DIAGNOSIS — E119 Type 2 diabetes mellitus without complications: Secondary | ICD-10-CM | POA: Diagnosis not present

## 2024-07-05 DIAGNOSIS — M19072 Primary osteoarthritis, left ankle and foot: Secondary | ICD-10-CM | POA: Diagnosis not present

## 2024-07-14 ENCOUNTER — Other Ambulatory Visit: Payer: Self-pay | Admitting: Cardiology

## 2024-07-14 DIAGNOSIS — I10 Essential (primary) hypertension: Secondary | ICD-10-CM

## 2024-08-16 ENCOUNTER — Encounter: Payer: Self-pay | Admitting: Radiology

## 2024-08-19 ENCOUNTER — Other Ambulatory Visit: Payer: Self-pay | Admitting: Cardiology

## 2024-08-19 DIAGNOSIS — I10 Essential (primary) hypertension: Secondary | ICD-10-CM

## 2024-08-20 ENCOUNTER — Telehealth: Payer: Self-pay | Admitting: Cardiology

## 2024-08-20 ENCOUNTER — Encounter: Payer: Self-pay | Admitting: Cardiology

## 2024-08-20 MED ORDER — OLMESARTAN MEDOXOMIL 20 MG PO TABS: 20.0000 mg | ORAL_TABLET | Freq: Every day | ORAL | 3 refills | Status: AC

## 2024-08-20 NOTE — Telephone Encounter (Signed)
*  STAT* If patient is at the pharmacy, call can be transferred to refill team.   1. Which medications need to be refilled? (please list name of each medication and dose if known)   olmesartan  (BENICAR ) 20 MG tablet   2. Would you like to learn more about the convenience, safety, & potential cost savings by using the [facility]?   3. Are you open to using the [facility] (Type [facility]. ).  4. Which pharmacy/location (including street and city if local pharmacy) is medication to be sent to?  [id number] - , [place] - [address] AT [address]   5. Do they need a 30 day or 90 day supply?   90 day  Patient stated he has a couple of days left of this medication.

## 2024-08-20 NOTE — Telephone Encounter (Signed)
 Refill sent.

## 2024-09-17 DIAGNOSIS — Z860101 Personal history of adenomatous and serrated colon polyps: Secondary | ICD-10-CM | POA: Diagnosis not present

## 2024-09-17 DIAGNOSIS — K648 Other hemorrhoids: Secondary | ICD-10-CM | POA: Diagnosis not present

## 2024-09-17 DIAGNOSIS — D125 Benign neoplasm of sigmoid colon: Secondary | ICD-10-CM | POA: Diagnosis not present

## 2024-09-17 DIAGNOSIS — D123 Benign neoplasm of transverse colon: Secondary | ICD-10-CM | POA: Diagnosis not present

## 2024-09-17 DIAGNOSIS — D124 Benign neoplasm of descending colon: Secondary | ICD-10-CM | POA: Diagnosis not present

## 2024-09-17 DIAGNOSIS — D120 Benign neoplasm of cecum: Secondary | ICD-10-CM | POA: Diagnosis not present

## 2024-09-17 DIAGNOSIS — Z09 Encounter for follow-up examination after completed treatment for conditions other than malignant neoplasm: Secondary | ICD-10-CM | POA: Diagnosis not present

## 2024-09-17 DIAGNOSIS — D122 Benign neoplasm of ascending colon: Secondary | ICD-10-CM | POA: Diagnosis not present

## 2024-09-17 DIAGNOSIS — K5730 Diverticulosis of large intestine without perforation or abscess without bleeding: Secondary | ICD-10-CM | POA: Diagnosis not present

## 2024-09-17 DIAGNOSIS — K635 Polyp of colon: Secondary | ICD-10-CM | POA: Diagnosis not present
